# Patient Record
Sex: Female | Born: 1985 | Race: White | Marital: Married | Smoking: Never smoker
Health system: Southern US, Community
[De-identification: ages and names within clinical notes are randomized; demographics above are authoritative.]

---
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
## 2007-05-13 HISTORY — PX: KNEE ARTHROSCOPY: SUR90

## 2013-05-12 HISTORY — PX: LUNG SURGERY: SHX703

## 2018-05-12 NOTE — L&D Delivery Note (Signed)
Delivery Note  First Stage: Labor onset: w/ AROM Augmentation: AROM Analgesia /Anesthesia intrapartum: epidura AROM at 3338  Second Stage: Complete dilation at 2214 Onset of pushing at 2230 FHR second stage cat II tracing, 115bpm, moderate variability.   Delivery of a viable female infant on 04/21/19 at 2251 by Hassan Buckler CNM delivery of fetal head in OA position with restitution to ROA. Loose nuchal cord x 2;  Anterior then posterior shoulders delivered easily with gentle downward traction. Baby placed on mom's chest, and attended to by peds.  Cord double clamped after cessation of pulsation, cut by CNM at parents request.   Third Stage: Placenta delivered spontaneously intact with 3VC @ 2257 Placenta disposition: routine disposal, calcifications noted throughout placenta maternal surface.  Uterine tone Firm / bleeding small.   No lacerations identified on inspection of vagina, cervix and perineum. Anesthesia for repair: n/a Repair n/a Est. Blood Loss (mL): 329VB  Complications: none  Mom to postpartum.  Baby to Couplet care / Skin to Skin.  Newborn: Birth Weight: pending  Apgar Scores: 8/9 Feeding planned: breast

## 2018-10-08 LAB — OB RESULTS CONSOLE RUBELLA ANTIBODY, IGM: Rubella: IMMUNE

## 2018-10-08 LAB — OB RESULTS CONSOLE HEPATITIS B SURFACE ANTIGEN: Hepatitis B Surface Ag: NEGATIVE

## 2018-10-08 LAB — OB RESULTS CONSOLE VARICELLA ZOSTER ANTIBODY, IGG: Varicella: IMMUNE

## 2019-04-05 LAB — OB RESULTS CONSOLE HIV ANTIBODY (ROUTINE TESTING): HIV: NONREACTIVE

## 2019-04-21 ENCOUNTER — Inpatient Hospital Stay: Payer: BLUE CROSS/BLUE SHIELD | Admitting: Anesthesiology

## 2019-04-21 ENCOUNTER — Inpatient Hospital Stay
Admission: EM | Admit: 2019-04-21 | Discharge: 2019-04-23 | DRG: 807 | Disposition: A | Discharge status: Home / Self Care | Payer: BLUE CROSS/BLUE SHIELD | Attending: Obstetrics and Gynecology | Admitting: Obstetrics and Gynecology

## 2019-04-21 ENCOUNTER — Other Ambulatory Visit: Payer: Self-pay

## 2019-04-21 DIAGNOSIS — Z3A38 38 weeks gestation of pregnancy: Secondary | ICD-10-CM | POA: Diagnosis not present

## 2019-04-21 DIAGNOSIS — O9081 Anemia of the puerperium: Secondary | ICD-10-CM | POA: Diagnosis not present

## 2019-04-21 DIAGNOSIS — O26893 Other specified pregnancy related conditions, third trimester: Secondary | ICD-10-CM | POA: Diagnosis present

## 2019-04-21 DIAGNOSIS — Z20828 Contact with and (suspected) exposure to other viral communicable diseases: Secondary | ICD-10-CM | POA: Diagnosis present

## 2019-04-21 LAB — CBC
HCT: 35.6 % — ABNORMAL LOW (ref 36.0–46.0)
Hemoglobin: 12 g/dL (ref 12.0–15.0)
MCH: 30.8 pg (ref 26.0–34.0)
MCHC: 33.7 g/dL (ref 30.0–36.0)
MCV: 91.3 fL (ref 80.0–100.0)
Platelets: 273 10*3/uL (ref 150–400)
RBC: 3.9 MIL/uL (ref 3.87–5.11)
RDW: 13.1 % (ref 11.5–15.5)
WBC: 12.8 10*3/uL — ABNORMAL HIGH (ref 4.0–10.5)
nRBC: 0 % (ref 0.0–0.2)

## 2019-04-21 LAB — TYPE AND SCREEN
ABO/RH(D): A POS
Antibody Screen: NEGATIVE

## 2019-04-21 LAB — RESPIRATORY PANEL BY RT PCR (FLU A&B, COVID)
Influenza A by PCR: NEGATIVE
Influenza B by PCR: NEGATIVE
SARS Coronavirus 2 by RT PCR: NEGATIVE

## 2019-04-21 MED ORDER — BUPIVACAINE HCL (PF) 0.25 % IJ SOLN
INTRAMUSCULAR | Status: DC | PRN
  Administered 2019-04-21: 4 mL via EPIDURAL

## 2019-04-21 MED ORDER — LACTATED RINGERS IV SOLN
500.0000 mL | INTRAVENOUS | Status: DC | PRN
  Administered 2019-04-21: 250 mL via INTRAVENOUS

## 2019-04-21 MED ORDER — FENTANYL 2.5 MCG/ML W/ROPIVACAINE 0.15% IN NS 100 ML EPIDURAL (ARMC)
EPIDURAL | Status: DC | PRN
  Administered 2019-04-21: 12 mL/h via EPIDURAL

## 2019-04-21 MED ORDER — OXYTOCIN 40 UNITS IN NORMAL SALINE INFUSION - SIMPLE MED
2.5000 [IU]/h | INTRAVENOUS | Status: DC
  Administered 2019-04-21: 2.5 [IU]/h via INTRAVENOUS
  Filled 2019-04-21: qty 1000

## 2019-04-21 MED ORDER — MISOPROSTOL 200 MCG PO TABS
ORAL_TABLET | ORAL | Status: AC
  Filled 2019-04-21: qty 4

## 2019-04-21 MED ORDER — ACETAMINOPHEN 325 MG PO TABS: 650.0000 mg | ORAL_TABLET | ORAL | Status: DC | PRN

## 2019-04-21 MED ORDER — FENTANYL CITRATE (PF) 100 MCG/2ML IJ SOLN: 50.0000 ug | INTRAMUSCULAR | Status: DC | PRN

## 2019-04-21 MED ORDER — LACTATED RINGERS IV SOLN
INTRAVENOUS | Status: DC
  Administered 2019-04-21 (×2): via INTRAVENOUS

## 2019-04-21 MED ORDER — IBUPROFEN 600 MG PO TABS
600.0000 mg | ORAL_TABLET | Freq: Four times a day (QID) | ORAL | Status: DC
  Administered 2019-04-22 – 2019-04-23 (×6): 600 mg via ORAL
  Filled 2019-04-21 (×6): qty 1

## 2019-04-21 MED ORDER — ONDANSETRON HCL 4 MG/2ML IJ SOLN: 4.0000 mg | Freq: Four times a day (QID) | INTRAMUSCULAR | Status: DC | PRN

## 2019-04-21 MED ORDER — LIDOCAINE HCL (PF) 1 % IJ SOLN
INTRAMUSCULAR | Status: DC | PRN
  Administered 2019-04-21: 3 mL via SUBCUTANEOUS

## 2019-04-21 MED ORDER — IBUPROFEN 600 MG PO TABS
ORAL_TABLET | ORAL | Status: AC
  Administered 2019-04-21: 600 mg via ORAL
  Filled 2019-04-21: qty 1

## 2019-04-21 MED ORDER — FENTANYL 2.5 MCG/ML W/ROPIVACAINE 0.15% IN NS 100 ML EPIDURAL (ARMC)
EPIDURAL | Status: AC
  Filled 2019-04-21: qty 100

## 2019-04-21 MED ORDER — SOD CITRATE-CITRIC ACID 500-334 MG/5ML PO SOLN: 30.0000 mL | ORAL | Status: DC | PRN

## 2019-04-21 MED ORDER — LIDOCAINE HCL (PF) 1 % IJ SOLN
30.0000 mL | INTRAMUSCULAR | Status: DC | PRN
  Filled 2019-04-21: qty 30

## 2019-04-21 MED ORDER — LIDOCAINE-EPINEPHRINE (PF) 1.5 %-1:200000 IJ SOLN
INTRAMUSCULAR | Status: DC | PRN
  Administered 2019-04-21: 4 mL via EPIDURAL

## 2019-04-21 MED ORDER — OXYTOCIN BOLUS FROM INFUSION
500.0000 mL | Freq: Once | INTRAVENOUS | Status: AC
  Administered 2019-04-21: 500 mL via INTRAVENOUS

## 2019-04-21 NOTE — Discharge Summary (Signed)
Obstetrical Discharge Summary  Patient Name: Felicia Hammond DOB: 1985-07-05 MRN: [id number]  Date of Admission: 04/21/2019 Date of Delivery: 04/21/19 Delivered by: Heloise Ochoa CNM Date of Discharge: 04/23/2019  Primary OB: [facility]  LMP:No LMP recorded. EDC Estimated Date of Delivery: 05/01/19 Gestational Age at Delivery: [redacted]w[redacted]d   Antepartum complications: 1. resolved low lying placenta at 28wks 2. Hx recurrent spontaneous pneumothorax, s/p catamenial lung surgery 3. IVF pregnancy, embryo tx on 08/13/18, normal fetal echo on 12/23/18  Admitting Diagnosis: labor, advanced cervical dilation Secondary Diagnosis: SVD  Patient Active Problem List   Diagnosis Date Noted  . NSVD (normal spontaneous vaginal delivery) 04/23/2019  . Anemia, postpartum 04/23/2019    Augmentation: AROM Complications: None Intrapartum complications/course: admitted with advanced cervical dilation, AROM performed, spontaneous progression to C/C/+1. Pushed effectively to SVD over intact perineum.  Date of Delivery: 04/21/19 Delivered by: Heloise Ochoa CNM Delivery Type: spontaneous vaginal delivery Anesthesia: epidural Placenta: spontaneous Laceration: none Episiotomy: none Newborn Data: Live born female "Christian" Birth Weight:  8#11  3960g APGAR: 8, 9  Newborn Delivery   Birth date/time: 04/21/2019 22:51:00 Delivery type: Vaginal, Spontaneous        Postpartum Procedures: none  Post partum course:  Patient had an uncomplicated postpartum course.  By time of discharge on PPD#2, her pain was controlled on oral pain medications; she had appropriate lochia and was ambulating, voiding without difficulty and tolerating regular diet.  She was deemed stable for discharge to home.    Discharge Physical Exam:  BP 110/81 (BP Location: Right Arm)   Pulse 98   Temp 98.6 F (37 C) (Oral)   Resp 18   Ht 5\' 6"  (1.676 m)   Wt 74.4 kg   SpO2 97%   Breastfeeding Unknown   BMI 26.47 kg/m    General: NAD CV: RRR Pulm: nl effort ABD: fundus firm and below the umbilicus Lochia: minimal Perineum: intact DVT Evaluation: LE non-ttp, no evidence of DVT on exam.  Hemoglobin  Date Value Ref Range Status  04/22/2019 9.9 (L) 12.0 - 15.0 g/dL Final   HCT  Date Value Ref Range Status  04/22/2019 31.4 (L) 36.0 - 46.0 % Final     Disposition: stable, discharge to home. Baby Feeding: breastmilk Baby Disposition: home with mom  Rh Immune globulin given: n/a Rubella vaccine given: immune Varicella vaccine given: immune Tdap vaccine given in AP setting: 02/07/19 Flu vaccine given in AP setting: 02/07/19  Contraception: n/a, infertility  Prenatal Labs:  Blood type/Rh   A Pos  Antibody screen neg  Rubella Immune  Varicella Immune  RPR NR  HBsAg Neg  HIV NR  GC neg  Chlamydia neg  Genetic screening negative  1 hour GTT  111  GBS  negative      Plan:  Felicia Hammond was discharged to home in good condition. Follow-up appointment with delivering provider in 6 weeks.  Discharge Medications: Allergies as of 04/23/2019      Reactions   Amoxicillin Rash      Medication List    TAKE these medications   multivitamin-prenatal 27-0.8 MG Tabs tablet Take 1 tablet by mouth daily at 12 noon.   valACYclovir 500 MG tablet Commonly known as: VALTREX Take 500 mg by mouth 2 (two) times daily.     Advised to get Ferrous Sulfate 325 mg - take 1 tab once a day  Follow-up Information    [place], 14/04/2019, CNM. Schedule an appointment as soon as possible for a visit  in 6 week(s).   Specialty: Obstetrics and Gynecology Contact information: [place] [address] [phone]           Signed:  Clydene Laming, CNM 04/23/2019  8:46 AM

## 2019-04-21 NOTE — Anesthesia Procedure Notes (Signed)
Epidural Patient location during procedure: OB Start time: 04/21/2019 4:44 PM End time: 04/21/2019 4:55 PM  Staffing Anesthesiologist: Gunnar Fusi, MD Resident/CRNA: Aline Brochure, CRNA Performed: resident/CRNA   Preanesthetic Checklist Completed: patient identified, IV checked, site marked, risks and benefits discussed, surgical consent, monitors and equipment checked, pre-op evaluation and timeout performed  Epidural Patient position: sitting Prep: ChloraPrep Patient monitoring: heart rate, continuous pulse ox and blood pressure Approach: midline Location: L3-L4 Injection technique: LOR air  Needle:  Needle type: Tuohy  Needle gauge: 17 G Needle length: 9 cm and 9 Needle insertion depth: 6 cm Catheter type: closed end flexible Catheter size: 19 Gauge Catheter at skin depth: 11 cm Test dose: negative and 1.5% lidocaine with Epi 1:200 K  Assessment Sensory level: T10 Events: blood not aspirated, injection not painful, no injection resistance, no paresthesia and negative IV test  Additional Notes   Patient tolerated the insertion well without complications.-SATD -IVTD. No paresthesia. Refer to [facility] for VS and dosingReason for block:procedure for pain

## 2019-04-21 NOTE — OB Triage Note (Signed)
Patient set to L&D for IOL due to advanced cervical dilation noted at [facility] visit today.  Patient is 7 cm dilated.

## 2019-04-21 NOTE — Anesthesia Preprocedure Evaluation (Signed)
Anesthesia Evaluation  Patient identified by MRN, date of birth, ID band Patient awake    Reviewed: Allergy & Precautions, H&P , NPO status , Patient's Chart, lab work & pertinent test results  History of Anesthesia Complications Negative for: history of anesthetic complications  Airway Mallampati: II       Dental no notable dental hx. (+) Teeth Intact   Pulmonary           Cardiovascular      Neuro/Psych    GI/Hepatic   Endo/Other    Renal/GU      Musculoskeletal   Abdominal   Peds  Hematology   Anesthesia Other Findings   Reproductive/Obstetrics (+) Pregnancy                             Anesthesia Physical Anesthesia Plan  ASA: II  Anesthesia Plan: Epidural   Post-op Pain Management:    Induction:   PONV Risk Score and Plan:   Airway Management Planned:   Additional Equipment:   Intra-op Plan:   Post-operative Plan:   Informed Consent: I have reviewed the patients History and Physical, chart, labs and discussed the procedure including the risks, benefits and alternatives for the proposed anesthesia with the patient or authorized representative who has indicated his/her understanding and acceptance.       Plan Discussed with: Anesthesiologist  Anesthesia Plan Comments:         Anesthesia Quick Evaluation

## 2019-04-21 NOTE — H&P (Signed)
OB History & Physical   History of Present Illness:  Chief Complaint: sent from office for advanced cervical dilation  HPI:  Felicia Hammond is a 33 y.o. G2P1001 female at [redacted]w[redacted]d dated by In Vitro and embryo transfer.  She presents to L&D for advanced dilation and augmentation. Reports active FM, occasional contractions. Slight bloody show noted.     Pregnancy Issues: 1. resolved low lying placenta at 28wks 2. Hx recurrent spontaneous pneumothorax, s/p catamenial lung surgery 3. IVF pregnancy, embryo tx on 08/13/18, normal fetal echo on 12/23/18   Maternal Medical History:  History reviewed. No pertinent past medical history.  Past Surgical History:  Procedure Laterality Date  . KNEE ARTHROSCOPY Left 2006  . KNEE ARTHROSCOPY Right 2009  . LUNG SURGERY Right 2015    Allergies  Allergen Reactions  . Amoxicillin Rash    Prior to Admission medications   Medication Sig Start Date End Date Taking? Authorizing Provider  Prenatal Vit-Fe Fumarate-FA (MULTIVITAMIN-PRENATAL) 27-0.8 MG TABS tablet Take 1 tablet by mouth daily at 12 noon.   Yes [provider]  valACYclovir (VALTREX) 500 MG tablet Take 500 mg by mouth 2 (two) times daily.   Yes [provider]     Prenatal care site: [facility] History: She  reports that she has never smoked. She has never used smokeless tobacco.  Family History: family history is not on file. She was adopted.   Review of Systems: A full review of systems was performed and negative except as noted in the HPI.     Physical Exam:  Vital Signs: BP 134/80   Pulse (!) 115   Temp 99.1 F (37.3 C) (Oral)   Resp 17   Ht 5\' 6"  (1.676 m)   Wt 74.4 kg   SpO2 100%   BMI 26.47 kg/m  General: no acute distress.  HEENT: normocephalic, atraumatic Heart: regular rate & rhythm.  No murmurs/rubs/gallops Lungs: clear to auscultation bilaterally, normal respiratory effort Abdomen: soft, gravid, non-tender;  EFW:  6lbs Pelvic:   External: Normal external female genitalia, no lesions noted.   Cervix: Dilation: 7.5 /   / Station: -1    Extremities: non-tender, symmetric, no edema bilaterally.  DTRs: 2+  Neurologic: Alert & oriented x 3.    Results for orders placed or performed during the hospital encounter of 04/21/19 (from the past 24 hour(s))  Respiratory Panel by RT PCR (Flu A&B, Covid) - Nasopharyngeal Swab     Status: None   Collection Time: 04/21/19  2:39 PM   Specimen: Nasopharyngeal Swab  Result Value Ref Range   SARS Coronavirus 2 by RT PCR NEGATIVE NEGATIVE   Influenza A by PCR NEGATIVE NEGATIVE   Influenza B by PCR NEGATIVE NEGATIVE  Type and screen [place]     Status: None   Collection Time: 04/21/19  3:24 PM  Result Value Ref Range   ABO/RH(D) A POS    Antibody Screen NEG    Sample Expiration      04/24/2019,2359 Performed at [facility], [address] [place], [place] [address]   CBC     Status: Abnormal   Collection Time: 04/21/19  3:36 PM  Result Value Ref Range   WBC 12.8 (H) 4.0 - 10.5 K/uL   RBC 3.90 3.87 - 5.11 MIL/uL   Hemoglobin 12.0 12.0 - 15.0 g/dL   HCT 35.6 (L) 36.0 - 46.0 %   MCV 91.3 80.0 - 100.0 fL   MCH 30.8  26.0 - 34.0 pg   MCHC 33.7 30.0 - 36.0 g/dL   RDW 93.2 67.1 - 24.5 %   Platelets 273 150 - 400 K/uL   nRBC 0.0 0.0 - 0.2 %    Pertinent Results:  Prenatal Labs: Blood type/Rh   A Pos  Antibody screen neg  Rubella Immune  Varicella Immune  RPR NR  HBsAg Neg  HIV NR  GC neg  Chlamydia neg  Genetic screening negative  1 hour GTT  111  GBS  negative   FHT: 120bpm, mod variability, 10*10accels TOCO: q6-6min SVE:  Dilation: 7.5 /   / Station: -1  AROM performed with fetal head well applied, large amt clear fluid noted. Fetal movement noted after AROM, fetal hand then noted at 3 o'clock, fingers palpated and then fetus drew hand back. Fetal heart rate dropped to 105bpm x , then back to previous  baseline 120bpm with moderate variability throughout.    Cephalic by leopolds  No results found.  Assessment:  Felicia Hammond is a 33 y.o. G2P1001 female at [redacted]w[redacted]d with advanced dilation, augmentation of labor.   Plan:  1. 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 Has been on prophy.   2. Fetal Well being  - Fetal Tracing: Cat I tracing now.  - Group B Streptococcus ppx indicated: negative - Presentation: cephalic confirmed by exam   3. Routine OB: - Prenatal labs reviewed, as above - Rh A Pos - CBC, T&S, RPR on admit - Clear fluids, IVF  4. Monitoring of Labor -  Contractions: external toco in place -  Pelvis proven to 8#15 -  Plan for augmentation with AROM, will consider Pitocin if UC pattern doesn't pick up.  -  Plan for continuous fetal monitoring  -  Maternal pain control as desired;  regional anesthesia - Anticipate vaginal delivery  5. Post Partum Planning: - Infant feeding: breast - Contraception: infertility, n/a  Randa Ngo, CNM 04/21/19 6:12 PM

## 2019-04-22 ENCOUNTER — Encounter: Payer: Self-pay | Admitting: Obstetrics and Gynecology

## 2019-04-22 LAB — CBC
HCT: 31.4 % — ABNORMAL LOW (ref 36.0–46.0)
Hemoglobin: 9.9 g/dL — ABNORMAL LOW (ref 12.0–15.0)
MCH: 30.1 pg (ref 26.0–34.0)
MCHC: 31.5 g/dL (ref 30.0–36.0)
MCV: 95.4 fL (ref 80.0–100.0)
Platelets: 270 10*3/uL (ref 150–400)
RBC: 3.29 MIL/uL — ABNORMAL LOW (ref 3.87–5.11)
RDW: 13.1 % (ref 11.5–15.5)
WBC: 18.7 10*3/uL — ABNORMAL HIGH (ref 4.0–10.5)
nRBC: 0 % (ref 0.0–0.2)

## 2019-04-22 LAB — RPR: RPR Ser Ql: NONREACTIVE

## 2019-04-22 MED ORDER — ONDANSETRON HCL 4 MG/2ML IJ SOLN: 4.0000 mg | INTRAMUSCULAR | Status: DC | PRN

## 2019-04-22 MED ORDER — BENZOCAINE-MENTHOL 20-0.5 % EX AERO: 1.0000 "application " | INHALATION_SPRAY | CUTANEOUS | Status: DC | PRN

## 2019-04-22 MED ORDER — DIPHENHYDRAMINE HCL 25 MG PO CAPS: 25.0000 mg | ORAL_CAPSULE | Freq: Four times a day (QID) | ORAL | Status: DC | PRN

## 2019-04-22 MED ORDER — COCONUT OIL OIL: 1.0000 "application " | TOPICAL_OIL | Status: DC | PRN

## 2019-04-22 MED ORDER — ACETAMINOPHEN 325 MG PO TABS: 650.0000 mg | ORAL_TABLET | ORAL | Status: DC | PRN

## 2019-04-22 MED ORDER — SIMETHICONE 80 MG PO CHEW: 80.0000 mg | CHEWABLE_TABLET | ORAL | Status: DC | PRN

## 2019-04-22 MED ORDER — PRENATAL MULTIVITAMIN CH
1.0000 | ORAL_TABLET | Freq: Every day | ORAL | Status: DC
  Administered 2019-04-22 – 2019-04-23 (×2): 1 via ORAL
  Filled 2019-04-22 (×2): qty 1

## 2019-04-22 MED ORDER — SENNOSIDES-DOCUSATE SODIUM 8.6-50 MG PO TABS: 2.0000 | ORAL_TABLET | ORAL | Status: DC

## 2019-04-22 MED ORDER — DIBUCAINE (PERIANAL) 1 % EX OINT: 1.0000 "application " | TOPICAL_OINTMENT | CUTANEOUS | Status: DC | PRN

## 2019-04-22 MED ORDER — ONDANSETRON HCL 4 MG PO TABS: 4.0000 mg | ORAL_TABLET | ORAL | Status: DC | PRN

## 2019-04-22 MED ORDER — WITCH HAZEL-GLYCERIN EX PADS: 1.0000 "application " | MEDICATED_PAD | CUTANEOUS | Status: DC | PRN

## 2019-04-22 MED ORDER — ZOLPIDEM TARTRATE 5 MG PO TABS: 5.0000 mg | ORAL_TABLET | Freq: Every evening | ORAL | Status: DC | PRN

## 2019-04-22 MED ORDER — SENNOSIDES-DOCUSATE SODIUM 8.6-50 MG PO TABS
2.0000 | ORAL_TABLET | ORAL | Status: DC
  Administered 2019-04-22 – 2019-04-23 (×2): 2 via ORAL
  Filled 2019-04-22 (×2): qty 2

## 2019-04-22 NOTE — Progress Notes (Signed)
Post Partum Day 1 Subjective: no complaints, up ad lib, voiding, tolerating PO and + flatus  Objective: Blood pressure 125/85, pulse 96, temperature 97.6 F (36.4 C), temperature source Oral, resp. rate 16, height 5\' 6"  (1.676 m), weight 74.4 kg, SpO2 98 %, unknown if currently breastfeeding.  Physical Exam:  General: alert and cooperative Lochia: appropriate Uterine Fundus: firm Incision: n/a DVT Evaluation: No evidence of DVT seen on physical exam.  Recent Labs    04/21/19 1536 04/22/19 0441  HGB 12.0 9.9*  HCT 35.6* 31.4*    Assessment/Plan: Plan for discharge tomorrow   LOS: 1 day   [place] 04/22/2019, 12:56 PM

## 2019-04-22 NOTE — Lactation Note (Signed)
This note was copied from a baby's chart. Lactation Consultation Note  Patient Name: Felicia Hammond [id number] Date: 04/22/2019 Reason for consult: Initial assessment;Early term 71-38.6wks Mom feels assured about  Breastfeeding, requests no breastfeeding help  Maternal Data Formula Feeding for Exclusion: No Has patient been taught Hand Expression?: No Does the patient have breastfeeding experience prior to this delivery?: Yes  Feeding Feeding Type: Breast Fed  LATCH Score Latch: Grasps breast easily, tongue down, lips flanged, rhythmical sucking.  Audible Swallowing: Spontaneous and intermittent  Type of Nipple: Everted at rest and after stimulation  Comfort (Breast/Nipple): Soft / non-tender  Hold (Positioning): No assistance needed to correctly position infant at breast.  LATCH Score: 10  Interventions    Lactation Tools Discussed/Used WIC Program: No   Consult Status Consult Status: PRN    Ferol Luz 04/22/2019, 5:12 PM

## 2019-04-22 NOTE — Anesthesia Postprocedure Evaluation (Signed)
Anesthesia Post Note  Patient: Felicia Hammond  Procedure(s) Performed: AN AD [place]  Patient location during evaluation: Mother Baby Anesthesia Type: Epidural Level of consciousness: awake and alert and oriented Pain management: pain level controlled Vital Signs Assessment: post-procedure vital signs reviewed and stable Respiratory status: spontaneous breathing and respiratory function stable Cardiovascular status: stable Postop Assessment: no headache, no backache, no apparent nausea or vomiting, adequate PO intake, able to ambulate and patient able to bend at knees Anesthetic complications: no     Last Vitals:  Vitals:   04/22/19 0158 04/22/19 0312  BP: 117/71 123/74  Pulse: 76 93  Resp: 18 18  Temp: 36.4 C (!) 36.4 C  SpO2: 99% 98%    Last Pain:  Vitals:   04/22/19 0556  TempSrc:   PainSc: 0-No pain                 Lanora Manis

## 2019-04-23 DIAGNOSIS — O9081 Anemia of the puerperium: Secondary | ICD-10-CM | POA: Diagnosis not present

## 2019-04-23 NOTE — Progress Notes (Signed)
Pt discharged with infant. Discharge instructions, prescriptions, and follow up appointments given to and reviewed with patient. Pt verbalized understanding. Escorted out by staff. 

## 2019-04-23 NOTE — Discharge Instructions (Signed)
Iron supplement: Ferrous Sulfate    Postpartum Care After Vaginal Delivery This sheet gives you information about how to care for yourself from the time you deliver your baby to up to 6-12 weeks after delivery (postpartum period). Your health care provider may also give you more specific instructions. If you have problems or questions, contact your health care provider. Follow these instructions at home: Vaginal bleeding  It is normal to have vaginal bleeding (lochia) after delivery. Wear a sanitary pad for vaginal bleeding and discharge. ? During the first week after delivery, the amount and appearance of lochia is often similar to a menstrual period. ? Over the next few weeks, it will gradually decrease to a dry, yellow-brown discharge. ? For most women, lochia stops completely by 4-6 weeks after delivery. Vaginal bleeding can vary from woman to woman.  Change your sanitary pads frequently. Watch for any changes in your flow, such as: ? A sudden increase in volume. ? A change in color. ? Large blood clots.  If you pass a blood clot from your vagina, save it and call your health care provider to discuss. Do not flush blood clots down the toilet before talking with your health care provider.  Do not use tampons or douches until your health care provider says this is safe.  If you are not breastfeeding, your period should return 6-8 weeks after delivery. If you are feeding your child breast milk only (exclusive breastfeeding), your period may not return until you stop breastfeeding. Perineal care  Keep the area between the vagina and the anus (perineum) clean and dry as told by your health care provider. Use medicated pads and pain-relieving sprays and creams as directed.  If you had a cut in the perineum (episiotomy) or a tear in the vagina, check the area for signs of infection until you are healed. Check for: ? More redness, swelling, or pain. ? Fluid or blood coming from the cut  or tear. ? Warmth. ? Pus or a bad smell.  You may be given a squirt bottle to use instead of wiping to clean the perineum area after you go to the bathroom. As you start healing, you may use the squirt bottle before wiping yourself. Make sure to wipe gently.  To relieve pain caused by an episiotomy, a tear in the vagina, or swollen veins in the anus (hemorrhoids), try taking a warm sitz bath 2-3 times a day. A sitz bath is a warm water bath that is taken while you are sitting down. The water should only come up to your hips and should cover your buttocks. Breast care  Within the first few days after delivery, your breasts may feel heavy, full, and uncomfortable (breast engorgement). Milk may also leak from your breasts. Your health care provider can suggest ways to help relieve the discomfort. Breast engorgement should go away within a few days.  If you are breastfeeding: ? Wear a bra that supports your breasts and fits you well. ? Keep your nipples clean and dry. Apply creams and ointments as told by your health care provider. ? You may need to use breast pads to absorb milk that leaks from your breasts. ? You may have uterine contractions every time you breastfeed for up to several weeks after delivery. Uterine contractions help your uterus return to its normal size. ? If you have any problems with breastfeeding, work with your health care provider or Advertising copywriter.  If you are not breastfeeding: ? Avoid touching your  breasts a lot. Doing this can make your breasts produce more milk. ? Wear a good-fitting bra and use cold packs to help with swelling. ? Do not squeeze out (express) milk. This causes you to make more milk. Intimacy and sexuality  Ask your health care provider when you can engage in sexual activity. This may depend on: ? Your risk of infection. ? How fast you are healing. ? Your comfort and desire to engage in sexual activity.  You are able to get pregnant after  delivery, even if you have not had your period. If desired, talk with your health care provider about methods of birth control (contraception). Medicines  Take over-the-counter and prescription medicines only as told by your health care provider.  If you were prescribed an antibiotic medicine, take it as told by your health care provider. Do not stop taking the antibiotic even if you start to feel better. Activity  Gradually return to your normal activities as told by your health care provider. Ask your health care provider what activities are safe for you.  Rest as much as possible. Try to rest or take a nap while your baby is sleeping. Eating and drinking   Drink enough fluid to keep your urine pale yellow.  Eat high-fiber foods every day. These may help prevent or relieve constipation. High-fiber foods include: ? Whole grain cereals and breads. ? Brown rice. ? Beans. ? Fresh fruits and vegetables.  Do not try to lose weight quickly by cutting back on calories.  Take your prenatal vitamins until your postpartum checkup or until your health care provider tells you it is okay to stop. Lifestyle  Do not use any products that contain nicotine or tobacco, such as cigarettes and e-cigarettes. If you need help quitting, ask your health care provider.  Do not drink alcohol, especially if you are breastfeeding. General instructions  Keep all follow-up visits for you and your baby as told by your health care provider. Most women visit their health care provider for a postpartum checkup within the first 3-6 weeks after delivery. Contact a health care provider if:  You feel unable to cope with the changes that your child brings to your life, and these feelings do not go away.  You feel unusually sad or worried.  Your breasts become red, painful, or hard.  You have a fever.  You have trouble holding urine or keeping urine from leaking.  You have little or no interest in activities  you used to enjoy.  You have not breastfed at all and you have not had a menstrual period for 12 weeks after delivery.  You have stopped breastfeeding and you have not had a menstrual period for 12 weeks after you stopped breastfeeding.  You have questions about caring for yourself or your baby.  You pass a blood clot from your vagina. Get help right away if:  You have chest pain.  You have difficulty breathing.  You have sudden, severe leg pain.  You have severe pain or cramping in your lower abdomen.  You bleed from your vagina so much that you fill more than one sanitary pad in one hour. Bleeding should not be heavier than your heaviest period.  You develop a severe headache.  You faint.  You have blurred vision or spots in your vision.  You have bad-smelling vaginal discharge.  You have thoughts about hurting yourself or your baby. If you ever feel like you may hurt yourself or others, or have thoughts  about taking your own life, get help right away. You can go to the nearest emergency department or call:  Your local emergency services (911 in the [place]  A suicide crisis helpline, such as the National Suicide Prevention Lifeline at 431-102-2960. This is open 24 hours a day. Summary  The period of time right after you deliver your newborn up to 6-12 weeks after delivery is called the postpartum period.  Gradually return to your normal activities as told by your health care provider.  Keep all follow-up visits for you and your baby as told by your health care provider. This information is not intended to replace advice given to you by your health care provider. Make sure you discuss any questions you have with your health care provider. Document Released: 02/23/2007 Document Revised: 05/01/2017 Document Reviewed: 02/09/2017 Elsevier Patient Education  2020 [employer].   Postpartum Baby Blues The postpartum period begins right after the birth of a baby. During  this time, there is often a lot of joy and excitement. It is also a time of many changes in the life of the parents. No matter how many times a mother gives birth, each child brings new challenges to the family, including different ways of relating to one another. It is common to have feelings of excitement along with confusing changes in moods, emotions, and thoughts. You may feel happy one minute and sad or stressed the next. These feelings of sadness usually happen in the period right after you have your baby, and they go away within a week or two. This is called the "baby blues." What are the causes? There is no known cause of baby blues. It is likely caused by a combination of factors. However, changes in hormone levels after childbirth are believed to trigger some of the symptoms. Other factors that can play a role in these mood changes include:  Lack of sleep.  Stressful life events, such as poverty, caring for a loved one, or death of a loved one.  Genetics. What are the signs or symptoms? Symptoms of this condition include:  Brief changes in mood, such as going from extreme happiness to sadness.  Decreased concentration.  Difficulty sleeping.  Crying spells and tearfulness.  Loss of appetite.  Irritability.  Anxiety. If the symptoms of baby blues last for more than 2 weeks or become more severe, you may have postpartum depression. How is this diagnosed? This condition is diagnosed based on an evaluation of your symptoms. There are no medical or lab tests that lead to a diagnosis, but there are various questionnaires that a health care provider may use to identify women with the baby blues or postpartum depression. How is this treated? Treatment is not needed for this condition. The baby blues usually go away on their own in 1-2 weeks. Social support is often all that is needed. You will be encouraged to get adequate sleep and rest. Follow these instructions at  home: Lifestyle      Get as much rest as you can. Take a nap when the baby sleeps.  Exercise regularly as told by your health care provider. Some women find yoga and walking to be helpful.  Eat a balanced and nourishing diet. This includes plenty of fruits and vegetables, whole grains, and lean proteins.  Do little things that you enjoy. Have a cup of tea, take a bubble bath, read your favorite magazine, or listen to your favorite music.  Avoid alcohol.  Ask for help with household  chores, cooking, grocery shopping, or running errands. Do not try to do everything yourself. Consider hiring a postpartum doula to help. This is a professional who specializes in providing support to new mothers.  Try not to make any major life changes during pregnancy or right after giving birth. This can add stress. General instructions  Talk to people close to you about how you are feeling. Get support from your partner, family members, friends, or other new moms. You may want to join a support group.  Find ways to cope with stress. This may include: ? Writing your thoughts and feelings in a journal. ? Spending time outside. ? Spending time with people who make you laugh.  Try to stay positive in how you think. Think about the things you are grateful for.  Take over-the-counter and prescription medicines only as told by your health care provider.  Let your health care provider know if you have any concerns.  Keep all postpartum visits as told by your health care provider. This is important. Contact a health care provider if:  Your baby blues do not go away after 2 weeks. Get help right away if:  You have thoughts of taking your own life (suicidal thoughts).  You think you may harm the baby or other people.  You see or hear things that are not there (hallucinations). Summary  After giving birth, you may feel happy one minute and sad or stressed the next. Feelings of sadness that happen  right after the baby is born and go away after a week or two are called the "baby blues."  You can manage the baby blues by getting enough rest, eating a healthy diet, exercising, spending time with supportive people, and finding ways to cope with stress.  If feelings of sadness and stress last longer than 2 weeks or get in the way of caring for your baby, talk to your health care provider. This may mean you have postpartum depression. This information is not intended to replace advice given to you by your health care provider. Make sure you discuss any questions you have with your health care provider. Document Released: 01/31/2004 Document Revised: 08/20/2018 Document Reviewed: 06/24/2016 Elsevier Patient Education  2020 [employer].   Breastfeeding and Self-Care Breastfeeding can be challenging, especially during the first few weeks after childbirth. It is normal to have some problems when you start to breastfeed your new baby, even if you have breastfed before. There are things that you can do to take care of yourself and help prevent common breastfeeding problems. Work with your health care provider or breastfeeding specialist (Science writer) to find strategies that work best for you. How does this affect me? Keeping your breasts healthy and ensuring that your baby attaches to your nipple well (a good latch) are important parts of having a good breastfeeding experience. Poor latching can lead to problems, such as:  Cracked or sore nipples.  Breasts becoming overfilled with milk (engorgement).  Plugged milk ducts.  Low milk supply.  Breast inflammation or infection. How does this affect my baby? By taking steps to avoid breastfeeding problems, you will help ensure that your baby can feed effectively and gain weight as he or she should. Follow these instructions at home: Breastfeeding strategy   Always make sure that your baby latches and is in a proper position. Try  different breastfeeding positions to find one that works best for you and your baby.  Breastfeed when you feel the need to reduce the  fullness of your breasts or when your baby shows signs of hunger. This is called "breastfeeding on demand."  Do not delay feedings.  Try to relax when it is time to feed your baby. This helps to trigger your let-down reflex, which releases milk from your breast.  To help increase milk flow: ? Pump or hand express a small amount of breast milk right before breastfeeding to soften your breast, areola, and nipple. ? Apply warm, moist heat to your breast right before feeding to increase circulation and help milk flow. You can do this in the shower or with hand towels soaked with warm water. ? Massage your breast right before or during feeding to increase circulation and help milk flow. Breast care   Ensure that your breasts stay moisturized and healthy. This will help prevent cracking and ease soreness. To do this: ? Avoid using soap on your nipples. ? Let your nipples air-dry for 3-4 minutes after each feeding. ? Use only cotton bra pads to absorb breast milk that leaks. Be sure to change the pads if they become soaked with milk. If you use disposable bra pads, change them often. ? Use lanolin on your nipples after nursing. If you use pure lanolin, you do not need to wash it off before feeding your baby again. Pure lanolin is not poisonous (toxic) to your baby. ? Massage some breast milk into your nipples: ? Use your hand to squeeze out a few drops of breast milk (hand express). ? Gently massage the milk into your nipples. ? Let your nipples air-dry.  Wear a supportive nursing bra. Avoid wearing tight clothing, bras that put pressure on your breasts, or underwire bras.  Use cold therapy to help relieve pain or swelling of your breasts: ? Put ice in a plastic bag. ? Place a towel between your skin and the bag. ? Leave the ice on for 20 minutes, 2-3 times a  day. General instructions  Drink enough fluid to keep your urine pale yellow.  Get plenty of rest. Sleep when your baby sleeps.  Talk to your health care provider or lactation consultant before taking any herbal supplements. Contact a health care provider if:  You have nipple pain.  You have cracking or soreness in your nipples that lasts longer than 1 week.  You have breast engorgement that lasts longer than 48 hours.  You have a fever.  You have pus-like discharge coming from your nipple.  You have redness, a rash, swelling, itching, or burning on your breast.  Your baby does not gain weight or loses weight. Summary  Keeping your breasts healthy and ensuring a good latch are important parts of having a good breastfeeding experience. Take steps to take care of yourself and work with your health care provider or breastfeeding specialist (Advertising copywriterlactation consultant) to find strategies that work best for you.  Always make sure that your baby is latched and positioned properly. Try different breastfeeding positions to find one that works best for you and your baby.  Keep your nipples moisturized, drink plenty of fluid, and get plenty of rest. Feed on demand, and do not delay feedings. This information is not intended to replace advice given to you by your health care provider. Make sure you discuss any questions you have with your health care provider. Document Released: 12/03/2016 Document Revised: 08/18/2018 Document Reviewed: 12/03/2016 Elsevier Patient Education  2020 [employer] Inc[employer]   Iron-Rich Diet  Iron is a mineral that helps your body to produce hemoglobin.  Hemoglobin is a protein in red blood cells that carries oxygen to your body's tissues. Eating too little iron may cause you to feel weak and tired, and it can increase your risk of infection. Iron is naturally found in many foods, and many foods have iron added to them (iron-fortified foods). You may need to follow an  iron-rich diet if you do not have enough iron in your body due to certain medical conditions. The amount of iron that you need each day depends on your age, your sex, and any medical conditions you have. Follow instructions from your health care provider or a diet and nutrition specialist (dietitian) about how much iron you should eat each day. What are tips for following this plan? Reading food labels  Check food labels to see how many milligrams (mg) of iron are in each serving. Cooking  Cook foods in pots and pans that are made from iron.  Take these steps to make it easier for your body to absorb iron from certain foods: ? Soak beans overnight before cooking. ? Soak whole grains overnight and drain them before using. ? Ferment flours before baking, such as by using yeast in bread dough. Meal planning  When you eat foods that contain iron, you should eat them with foods that are high in vitamin C. These include oranges, peppers, tomatoes, potatoes, and mango. Vitamin C helps your body to absorb iron. General information  Take iron supplements only as told by your health care provider. An overdose of iron can be life-threatening. If you were prescribed iron supplements, take them with orange juice or a vitamin C supplement.  When you eat iron-fortified foods or take an iron supplement, you should also eat foods that naturally contain iron, such as meat, poultry, and fish. Eating naturally iron-rich foods helps your body to absorb the iron that is added to other foods or contained in a supplement.  Certain foods and drinks prevent your body from absorbing iron properly. Avoid eating these foods in the same meal as iron-rich foods or with iron supplements. These foods include: ? Coffee, black tea, and red wine. ? Milk, dairy products, and foods that are high in calcium. ? Beans and soybeans. ? Whole grains. What foods should I eat? Fruits Prunes. Raisins. Eat fruits high in vitamin C,  such as oranges, grapefruits, and strawberries, alongside iron-rich foods. Vegetables Spinach (cooked). Green peas. Broccoli. Fermented vegetables. Eat vegetables high in vitamin C, such as leafy greens, potatoes, bell peppers, and tomatoes, alongside iron-rich foods. Grains Iron-fortified breakfast cereal. Iron-fortified whole-wheat bread. Enriched rice. Sprouted grains. Meats and other proteins Beef liver. Oysters. Beef. Shrimp. [place]. Chicken. Tuna. Sardines. Chickpeas. Nuts. Tofu. Pumpkin seeds. Beverages Tomato juice. Fresh orange juice. Prune juice. Hibiscus tea. Fortified instant breakfast shakes. Sweets and desserts Blackstrap molasses. Seasonings and condiments Tahini. Fermented soy sauce. Other foods Wheat germ. The items listed above may not be a complete list of recommended foods and beverages. Contact a dietitian for more information. What foods should I avoid? Grains Whole grains. Bran cereal. Bran flour. Oats. Meats and other proteins Soybeans. Products made from soy protein. Black beans. Lentils. Mung beans. Split peas. Dairy Milk. Cream. Cheese. Yogurt. Cottage cheese. Beverages Coffee. Black tea. Red wine. Sweets and desserts Cocoa. Chocolate. Ice cream. Other foods Basil. Oregano. Large amounts of parsley. The items listed above may not be a complete list of foods and beverages to avoid. Contact a dietitian for more information. Summary  Iron is a mineral that  helps your body to produce hemoglobin. Hemoglobin is a protein in red blood cells that carries oxygen to your body's tissues.  Iron is naturally found in many foods, and many foods have iron added to them (iron-fortified foods).  When you eat foods that contain iron, you should eat them with foods that are high in vitamin C. Vitamin C helps your body to absorb iron.  Certain foods and drinks prevent your body from absorbing iron properly, such as whole grains and dairy products. You should avoid eating  these foods in the same meal as iron-rich foods or with iron supplements. This information is not intended to replace advice given to you by your health care provider. Make sure you discuss any questions you have with your health care provider. Document Released: 12/10/2004 Document Revised: 04/10/2017 Document Reviewed: 03/24/2017 Elsevier Patient Education  2020 [employer].

## 2019-04-30 ENCOUNTER — Emergency Department: Payer: BLUE CROSS/BLUE SHIELD | Admitting: Anesthesiology

## 2019-04-30 ENCOUNTER — Encounter
Admission: EM | Disposition: A | Discharge status: Home / Self Care | Payer: Self-pay | Source: Home / Self Care | Attending: Emergency Medicine

## 2019-04-30 ENCOUNTER — Encounter: Payer: Self-pay | Admitting: Emergency Medicine

## 2019-04-30 ENCOUNTER — Ambulatory Visit
Admission: EM | Admit: 2019-04-30 | Discharge: 2019-04-30 | Disposition: A | Discharge status: Home / Self Care | Payer: BLUE CROSS/BLUE SHIELD | Attending: Obstetrics and Gynecology | Admitting: Obstetrics and Gynecology

## 2019-04-30 ENCOUNTER — Other Ambulatory Visit: Payer: Self-pay

## 2019-04-30 DIAGNOSIS — N939 Abnormal uterine and vaginal bleeding, unspecified: Secondary | ICD-10-CM | POA: Diagnosis not present

## 2019-04-30 DIAGNOSIS — Z20828 Contact with and (suspected) exposure to other viral communicable diseases: Secondary | ICD-10-CM | POA: Insufficient documentation

## 2019-04-30 DIAGNOSIS — Z880 Allergy status to penicillin: Secondary | ICD-10-CM | POA: Insufficient documentation

## 2019-04-30 DIAGNOSIS — D649 Anemia, unspecified: Secondary | ICD-10-CM | POA: Insufficient documentation

## 2019-04-30 HISTORY — PX: DILATION AND EVACUATION: SHX1459

## 2019-04-30 LAB — BASIC METABOLIC PANEL
Anion gap: 8 (ref 5–15)
BUN: 15 mg/dL (ref 6–20)
CO2: 26 mmol/L (ref 22–32)
Calcium: 8.9 mg/dL (ref 8.9–10.3)
Chloride: 104 mmol/L (ref 98–111)
Creatinine, Ser: 0.57 mg/dL (ref 0.44–1.00)
GFR calc Af Amer: >60 mL/min (ref >60)
GFR calc non Af Amer: >60 mL/min (ref >60)
Glucose, Bld: 97 mg/dL (ref 70–99)
Potassium: 3.9 mmol/L (ref 3.5–5.1)
Sodium: 138 mmol/L (ref 135–145)

## 2019-04-30 LAB — CBC WITH DIFFERENTIAL/PLATELET
Abs Immature Granulocytes: 0.03 10*3/uL (ref 0.00–0.07)
Basophils Absolute: 0.1 10*3/uL (ref 0.0–0.1)
Basophils Relative: 1 %
Eosinophils Absolute: 0.2 10*3/uL (ref 0.0–0.5)
Eosinophils Relative: 2 %
HCT: 36.2 % (ref 36.0–46.0)
Hemoglobin: 11.5 g/dL — ABNORMAL LOW (ref 12.0–15.0)
Immature Granulocytes: 0 %
Lymphocytes Relative: 24 %
Lymphs Abs: 1.9 10*3/uL (ref 0.7–4.0)
MCH: 30 pg (ref 26.0–34.0)
MCHC: 31.8 g/dL (ref 30.0–36.0)
MCV: 94.5 fL (ref 80.0–100.0)
Monocytes Absolute: 0.5 10*3/uL (ref 0.1–1.0)
Monocytes Relative: 7 %
Neutro Abs: 5 10*3/uL (ref 1.7–7.7)
Neutrophils Relative %: 66 %
Platelets: 459 10*3/uL — ABNORMAL HIGH (ref 150–400)
RBC: 3.83 MIL/uL — ABNORMAL LOW (ref 3.87–5.11)
RDW: 13.1 % (ref 11.5–15.5)
WBC: 7.7 10*3/uL (ref 4.0–10.5)
nRBC: 0 % (ref 0.0–0.2)

## 2019-04-30 LAB — TYPE AND SCREEN
ABO/RH(D): A POS
Antibody Screen: NEGATIVE

## 2019-04-30 LAB — RESPIRATORY PANEL BY RT PCR (FLU A&B, COVID)
Influenza A by PCR: NEGATIVE
Influenza B by PCR: NEGATIVE
SARS Coronavirus 2 by RT PCR: NEGATIVE

## 2019-04-30 SURGERY — DILATION AND EVACUATION, UTERUS
Anesthesia: General

## 2019-04-30 MED ORDER — IBUPROFEN 600 MG PO TABS
ORAL_TABLET | ORAL | Status: AC
  Filled 2019-04-30: qty 1

## 2019-04-30 MED ORDER — IBUPROFEN 600 MG PO TABS
600.0000 mg | ORAL_TABLET | Freq: Four times a day (QID) | ORAL | Status: DC | PRN
  Administered 2019-04-30: 600 mg via ORAL
  Filled 2019-04-30: qty 1

## 2019-04-30 MED ORDER — MIDAZOLAM HCL 2 MG/2ML IJ SOLN
INTRAMUSCULAR | Status: AC
  Filled 2019-04-30: qty 2

## 2019-04-30 MED ORDER — CELECOXIB 200 MG PO CAPS
ORAL_CAPSULE | ORAL | Status: AC
  Filled 2019-04-30: qty 2

## 2019-04-30 MED ORDER — TRANEXAMIC ACID 1000 MG/10ML IV SOLN
INTRAVENOUS | Status: AC
  Filled 2019-04-30: qty 10

## 2019-04-30 MED ORDER — LACTATED RINGERS IV SOLN: INTRAVENOUS | Status: DC | PRN

## 2019-04-30 MED ORDER — DOXYCYCLINE HYCLATE 100 MG PO TABS
200.0000 mg | ORAL_TABLET | Freq: Once | ORAL | Status: DC
  Filled 2019-04-30: qty 2

## 2019-04-30 MED ORDER — PHENYLEPHRINE HCL (PRESSORS) 10 MG/ML IV SOLN
INTRAVENOUS | Status: DC | PRN
  Administered 2019-04-30: 100 ug via INTRAVENOUS

## 2019-04-30 MED ORDER — FENTANYL CITRATE (PF) 100 MCG/2ML IJ SOLN
25.0000 ug | INTRAMUSCULAR | Status: DC | PRN
  Administered 2019-04-30: 50 ug via INTRAVENOUS

## 2019-04-30 MED ORDER — ACETAMINOPHEN 500 MG PO TABS
ORAL_TABLET | ORAL | Status: AC
  Filled 2019-04-30: qty 2

## 2019-04-30 MED ORDER — FENTANYL CITRATE (PF) 100 MCG/2ML IJ SOLN
INTRAMUSCULAR | Status: AC
  Filled 2019-04-30: qty 2

## 2019-04-30 MED ORDER — LACTATED RINGERS IV SOLN
INTRAVENOUS | Status: DC
  Administered 2019-04-30: 125 mL/h via INTRAVENOUS

## 2019-04-30 MED ORDER — PROPOFOL 500 MG/50ML IV EMUL
INTRAVENOUS | Status: AC
  Filled 2019-04-30: qty 50

## 2019-04-30 MED ORDER — LIDOCAINE HCL (CARDIAC) PF 100 MG/5ML IV SOSY
PREFILLED_SYRINGE | INTRAVENOUS | Status: DC | PRN
  Administered 2019-04-30: 100 mg via INTRAVENOUS

## 2019-04-30 MED ORDER — TRANEXAMIC ACID 1000 MG/10ML IV SOLN
INTRAVENOUS | Status: DC | PRN
  Administered 2019-04-30: 18:00:00 1000 mg via INTRAVENOUS

## 2019-04-30 MED ORDER — ACETAMINOPHEN 500 MG PO TABS
1000.0000 mg | ORAL_TABLET | ORAL | Status: AC
  Administered 2019-04-30: 1000 mg via ORAL

## 2019-04-30 MED ORDER — METHYLERGONOVINE MALEATE 0.2 MG/ML IJ SOLN
INTRAMUSCULAR | Status: AC
  Filled 2019-04-30: qty 1

## 2019-04-30 MED ORDER — PROMETHAZINE HCL 25 MG/ML IJ SOLN: 6.2500 mg | INTRAMUSCULAR | Status: DC | PRN

## 2019-04-30 MED ORDER — FENTANYL CITRATE (PF) 100 MCG/2ML IJ SOLN
INTRAMUSCULAR | Status: DC | PRN
  Administered 2019-04-30 (×2): 25 ug via INTRAVENOUS
  Administered 2019-04-30: 50 ug via INTRAVENOUS

## 2019-04-30 MED ORDER — PROPOFOL 10 MG/ML IV BOLUS
INTRAVENOUS | Status: DC | PRN
  Administered 2019-04-30: 150 mg via INTRAVENOUS

## 2019-04-30 MED ORDER — GABAPENTIN 300 MG PO CAPS
300.0000 mg | ORAL_CAPSULE | ORAL | Status: AC
  Administered 2019-04-30: 300 mg via ORAL

## 2019-04-30 MED ORDER — METHYLERGONOVINE MALEATE 0.2 MG/ML IJ SOLN
INTRAMUSCULAR | Status: DC | PRN
  Administered 2019-04-30: .2 mg via INTRAMUSCULAR

## 2019-04-30 MED ORDER — CELECOXIB 200 MG PO CAPS
400.0000 mg | ORAL_CAPSULE | ORAL | Status: AC
  Administered 2019-04-30: 400 mg via ORAL

## 2019-04-30 MED ORDER — MIDAZOLAM HCL 2 MG/2ML IJ SOLN
INTRAMUSCULAR | Status: DC | PRN
  Administered 2019-04-30: 2 mg via INTRAVENOUS

## 2019-04-30 MED ORDER — GABAPENTIN 300 MG PO CAPS
ORAL_CAPSULE | ORAL | Status: AC
  Filled 2019-04-30: qty 1

## 2019-04-30 MED ORDER — CEFAZOLIN SODIUM-DEXTROSE 1-4 GM/50ML-% IV SOLN
INTRAVENOUS | Status: DC | PRN
  Administered 2019-04-30: 1 g via INTRAVENOUS

## 2019-04-30 SURGICAL SUPPLY — 22 items
CATH ROBINSON RED A/P 16FR (CATHETERS) ×3 IMPLANT
COVER WAND RF STERILE (DRAPES) ×3 IMPLANT
FILTER UTR ASPR SPEC (MISCELLANEOUS) ×1 IMPLANT
FLTR UTR ASPR SPEC (MISCELLANEOUS) ×3
GAUZE 4X4 16PLY RFD (DISPOSABLE) ×6 IMPLANT
GLOVE BIO SURGEON STRL SZ7 (GLOVE) ×3 IMPLANT
GLOVE INDICATOR 7.5 STRL GRN (GLOVE) ×3 IMPLANT
GOWN STRL REUS W/ TWL LRG LVL3 (GOWN DISPOSABLE) ×2 IMPLANT
GOWN STRL REUS W/TWL LRG LVL3 (GOWN DISPOSABLE) ×4
KIT BERKELEY 1ST TRIMESTER 3/8 (MISCELLANEOUS) ×3 IMPLANT
KIT TURNOVER CYSTO (KITS) ×3 IMPLANT
PACK DNC HYST (MISCELLANEOUS) ×3 IMPLANT
PAD OB MATERNITY 4.3X12.25 (PERSONAL CARE ITEMS) ×3 IMPLANT
PAD PREP 24X41 OB/GYN DISP (PERSONAL CARE ITEMS) ×3 IMPLANT
SET BERKELEY SUCTION TUBING (SUCTIONS) ×3 IMPLANT
TOWEL OR 17X26 4PK STRL BLUE (TOWEL DISPOSABLE) ×3 IMPLANT
VACURETTE 10 RIGID CVD (CANNULA) IMPLANT
VACURETTE 6 ASPIR F TIP BERK (CANNULA) IMPLANT
VACURETTE 7MM F TIP (CANNULA)
VACURETTE 7MM F TIP STRL (CANNULA) IMPLANT
VACURETTE 8 RIGID CVD (CANNULA) IMPLANT
VACURETTE 8MM F TIP (MISCELLANEOUS) ×3 IMPLANT

## 2019-04-30 NOTE — Anesthesia Procedure Notes (Signed)
Procedure Name: LMA Insertion Date/Time: 04/30/2019 5:48 PM Performed by: [employer], CRNA Pre-anesthesia Checklist: Patient identified, Patient being monitored, Timeout performed, Emergency Drugs available and Suction available Patient Re-evaluated:Patient Re-evaluated prior to induction Oxygen Delivery Method: Circle system utilized Preoxygenation: Pre-oxygenation with 100% oxygen Induction Type: IV induction Ventilation: Mask ventilation without difficulty LMA: LMA inserted LMA Size: 3.5 Tube type: Oral Number of attempts: 1 Placement Confirmation: positive ETCO2 and breath sounds checked- equal and bilateral Tube secured with: Tape Dental Injury: Teeth and Oropharynx as per pre-operative assessment

## 2019-04-30 NOTE — ED Notes (Signed)
Pt transported to OR

## 2019-04-30 NOTE — Anesthesia Post-op Follow-up Note (Signed)
Anesthesia QCDR form completed.        

## 2019-04-30 NOTE — Anesthesia Preprocedure Evaluation (Addendum)
Anesthesia Evaluation  Patient identified by MRN, date of birth, ID band Patient awake    Reviewed: Allergy & Precautions, H&P , NPO status , Patient's Chart, lab work & pertinent test results  History of Anesthesia Complications Negative for: history of anesthetic complications  Airway Mallampati: I       Dental  (+) Dental Advidsory Given, Teeth Intact   Pulmonary neg pulmonary ROS,    Pulmonary exam normal        Cardiovascular Exercise Tolerance: Good negative cardio ROS Normal cardiovascular exam     Neuro/Psych negative neurological ROS     GI/Hepatic negative GI ROS, Neg liver ROS,   Endo/Other  negative endocrine ROS  Renal/GU negative Renal ROS  negative genitourinary   Musculoskeletal   Abdominal   Peds  Hematology negative hematology ROS (+)   Anesthesia Other Findings History reviewed. No pertinent past medical history.   Reproductive/Obstetrics negative OB ROS                             Anesthesia Physical  Anesthesia Plan  ASA: II  Anesthesia Plan: General   Post-op Pain Management:    Induction: Intravenous  PONV Risk Score and Plan: 3 and Ondansetron, Dexamethasone, Midazolam and Treatment may vary due to age or medical condition  Airway Management Planned: LMA  Additional Equipment:   Intra-op Plan:   Post-operative Plan: Extubation in OR  Informed Consent: I have reviewed the patients History and Physical, chart, labs and discussed the procedure including the risks, benefits and alternatives for the proposed anesthesia with the patient or authorized representative who has indicated his/her understanding and acceptance.       Plan Discussed with: Anesthesiologist  Anesthesia Plan Comments:        Anesthesia Quick Evaluation

## 2019-04-30 NOTE — ED Notes (Signed)
Report given to OR.

## 2019-04-30 NOTE — Op Note (Signed)
Operative Report Suction Dilation and Curettage   Indications: Postpartum bleeding  Pre-operative Diagnosis:  Suspected retained products of conception   Post-operative Diagnosis: same.  Procedure: 1. Suction D&C  Surgeon: Benjaman Kindler, MD  Assistant(s):  None  Anesthesia: General LMA anesthesia  Anesthesiologist: No responsible provider has been recorded for the case. Anesthesiologist: Martha Clan, MD CRNA: Justus Memory, CRNA  Estimated Blood Loss:  150         Intraoperative medications: 0.2mg  IM methergine; 1g TXA         Total IV Fluids: 1020ml  Urine Output: 278ml         Specimens: products of conception         Complications:  None; patient tolerated the procedure well.         Disposition: PACU - hemodynamically stable.         Condition: stable  Findings: Uterus measuring 8 weeks; normal cervix, vagina, perineum. Cervix open with large clots at os.  Indication for procedure/Consents: 33 y.o. F here for scheduled surgery for treatment of postpartum bleeding with suspected retained products of conception.  Risks of surgery were discussed with the patient including but not limited to: bleeding which may require transfusion; infection which may require antibiotics; injury to uterus or surrounding organs; intrauterine scarring which may impair future fertility; need for additional procedures including laparotomy or laparoscopy; and other postoperative/anesthesia complications. Written informed consent was obtained.    Procedure Details:    She was taken to the operating room where general anesthesia was administered and was found to be adequate.  1g ancef given.  After a formal and adequate timeout was performed, she was placed in the dorsal lithotomy position and examined with the above findings. She was then prepped and draped in the sterile manner.   Her bladder was catheterized for an estimated amount of clear, yellow urine. A speculum was then placed  in the patient's vagina and a single tooth tenaculum was applied to the anterior lip of the cervix.    No uterine sounding was performed on this pregnant uterus. Her cervix was serially dilated to accommodate a 8 sized flexible suction curette.  A gentle sharp curettage was then performed until there was a gritty texture in all four quadrants.  Firm bimanual pressure held for about 15 min while methergine and TXA was given. The tenaculum was removed from the anterior lip of the cervix and the vaginal speculum was removed after noting good hemostasis. The patient tolerated the procedure well and was taken to the recovery area awake, extubated and in stable condition.  The patient will be discharged to home as per PACU criteria.  She will continue her home keflex as prescribed. Routine postoperative instructions given.  She was prescribed Percocet, Ibuprofen and Colace.  She will follow up in the clinic in two weeks for postoperative evaluation.

## 2019-04-30 NOTE — Discharge Instructions (Addendum)
AMBULATORY SURGERY  DISCHARGE INSTRUCTIONS   1) The drugs that you were given will stay in your system until tomorrow so for the next 24 hours you should not:  A) Drive an automobile B) Make any legal decisions C) Drink any alcoholic beverage   2) You may resume regular meals tomorrow.  Today it is better to start with liquids and gradually work up to solid foods.  You may eat anything you prefer, but it is better to start with liquids, then soup and crackers, and gradually work up to solid foods.   3) Please notify your doctor immediately if you have any unusual bleeding, trouble breathing, redness and pain at the surgery site, drainage, fever, or pain not relieved by medication.    4) Additional Instructions:  Ibuprofen given at 730 pm - may have again at 130 am if needed        Please contact your physician with any problems or Same Day Surgery at [phone], Monday through Friday 6 am to 4 pm, or [place] at [facility] number at [phone].

## 2019-04-30 NOTE — H&P (Addendum)
Consult History and Physical   SERVICE: Gynecology   Patient Name: Felicia Hammond Patient MRN:   [id number]  CC: Uterine pain, resolving bleeding  HPI: Felicia Hammond is a 33 y.o. [id number] with pelvic pain and bleeding 10 days after an otherwise uncomplicated vaginal delivery. 2 days ago she was seen in the outpatient office with an ultrasound suggestive of retained products of conception vs large endometrial clot. She was given 48 hrs of po methergine and Keflex, without improvement in pain, currently a 6/10. We discussed in office a suction D&C and she presents for this management.   Review of Systems: positives in bold GEN:   fevers, chills, weight changes, appetite changes, fatigue, night sweats HEENT:  HA, vision changes, hearing loss, congestion, rhinorrhea, sinus pressure, dysphagia CV:   CP, palpitations PULM:  SOB, cough GI:  Pelvic pain, vaginal bleeding, N/V/D/C GU:  dysuria, urgency, frequency MSK:  arthralgias, myalgias, back pain, swelling SKIN:  rashes, color changes, pallor NEURO:  numbness, weakness, tingling, seizures, dizziness, tremors PSYCH:  depression, anxiety, behavioral problems, confusion  HEME/LYMPH:  easy bruising or bleeding ENDO:  heat/cold intolerance  Past Obstetrical History: OB History    Gravida  2   Para  2   Term  2   Preterm      AB      Living  2     SAB      TAB      Ectopic      Multiple  0   Live Births  2           Past Gynecologic History: No LMP recorded. s/p vaginal delivery  Past Medical History: History reviewed. No pertinent past medical history.  Past Surgical History:   Past Surgical History:  Procedure Laterality Date  . KNEE ARTHROSCOPY Left 2006  . KNEE ARTHROSCOPY Right 2009  . LUNG SURGERY Right 2015    Family History:  family history is not on file. She was adopted.  Social History:  Social History   Socioeconomic History  . Marital status: Married    Spouse name: Thurmond Butts  . Number  of children: Not on file  . Years of education: Not on file  . Highest education level: Not on file  Occupational History  . Not on file  Tobacco Use  . Smoking status: Never Smoker  . Smokeless tobacco: Never Used  Substance and Sexual Activity  . Alcohol use: Not on file  . Drug use: Not on file  . Sexual activity: Yes    Birth control/protection: None  Other Topics Concern  . Not on file  Social History Narrative  . Not on file   Social Determinants of Health   Financial Resource Strain:   . Difficulty of Paying Living Expenses: Not on file  Food Insecurity:   . Worried About Charity fundraiser in the Last Year: Not on file  . Ran Out of Food in the Last Year: Not on file  Transportation Needs:   . Lack of Transportation (Medical): Not on file  . Lack of Transportation (Non-Medical): Not on file  Physical Activity:   . Days of Exercise per Week: Not on file  . Minutes of Exercise per Session: Not on file  Stress:   . Feeling of Stress : Not on file  Social Connections:   . Frequency of Communication with Friends and Family: Not on file  . Frequency of Social Gatherings with Friends and Family: Not on file  .  Attends Religious Services: Not on file  . Active Member of Clubs or Organizations: Not on file  . Attends BankerClub or Organization Meetings: Not on file  . Marital Status: Not on file  Intimate Partner Violence:   . Fear of Current or Ex-Partner: Not on file  . Emotionally Abused: Not on file  . Physically Abused: Not on file  . Sexually Abused: Not on file    Home Medications:  Medications reconciled in EPIC  No current facility-administered medications on file prior to encounter.   Current Outpatient Medications on File Prior to Encounter  Medication Sig Dispense Refill  . cephALEXin (KEFLEX) 250 MG capsule Take 250 mg by mouth 4 (four) times daily.    . Prenatal Vit-Fe Fumarate-FA (MULTIVITAMIN-PRENATAL) 27-0.8 MG TABS tablet Take 1 tablet by mouth daily  at 12 noon.      Allergies:  Allergies  Allergen Reactions  . Amoxicillin Rash    Physical Exam:  Temp:  [98.7 F (37.1 C)] 98.7 F (37.1 C) (12/19 1056) Pulse Rate:  [69-93] 77 (12/19 1552) Resp:  [16] 16 (12/19 1552) BP: (101-122)/(70-87) 107/74 (12/19 1552) SpO2:  [98 %-100 %] 100 % (12/19 1552) Weight:  [74.4 kg] 74.4 kg (12/19 1053)   General Appearance:  Well developed, well nourished, no acute distress, alert and oriented x3 HEENT:  Normocephalic atraumatic, extraocular movements intact, moist mucous membranes Cardiovascular:  Normal S1/S2, regular rate and rhythm, no murmurs Pulmonary:  clear to auscultation, no wheezes, rales or rhonchi, symmetric air entry, good air exchange Abdomen:  Bowel sounds present, soft, nontender, nondistended, no abnormal masses, no epigastric pain Extremities:  Full range of motion, no pedal edema, 2+ distal pulses, no tenderness Skin:  normal coloration and turgor, no rashes, no suspicious skin lesions noted  Neurologic:  Cranial nerves 2-12 grossly intact Psychiatric:  Normal mood and affect, appropriate, no AH/VH Pelvic:  Examine in office   Labs/Studies:   CBC and Coags:  Lab Results  Component Value Date   WBC 7.7 04/30/2019   NEUTOPHILPCT 66 04/30/2019   EOSPCT 2 04/30/2019   BASOPCT 1 04/30/2019   LYMPHOPCT 24 04/30/2019   HGB 11.5 (L) 04/30/2019   HCT 36.2 04/30/2019   MCV 94.5 04/30/2019   PLT 459 (H) 04/30/2019   CMP:  Lab Results  Component Value Date   NA 138 04/30/2019   K 3.9 04/30/2019   CL 104 04/30/2019   CO2 26 04/30/2019   BUN 15 04/30/2019   CREATININE 0.57 04/30/2019    TVUS:  Thickened ES 27.4366mm with normal ovaries bilaterally Other Imaging: No results found.   Assessment / Plan:   Felicia Hammond is a 33 y.o. [id number] who presents with pain and heterogenous material in the uterine cavity 2 weeks after a vaginal delivery, s/p methergine for 48 hrs and abx. No elevated WBC today.  Retained  products of conception, failed medical management:   Pt has opted for: suction D&C. She came in through the ER and her covid is negative. She ate a cookie at 9am and therefore we will wait 8 hours at anesthesia's request for this non-emergent surgery.  Consents signed today. Risks of surgery were discussed with the patient including but not limited to: bleeding which may require transfusion; infection which may require antibiotics; injury to uterus or surrounding organs; intrauterine scarring which may impair future fertility; need for additional procedures including laparotomy or laparoscopy; and other postoperative/anesthesia complications. Written informed consent was obtained.  This is a scheduled same-day surgery.  She will have a postop visit in 2 weeks to review operative findings and pathology.

## 2019-04-30 NOTE — Transfer of Care (Signed)
Immediate Anesthesia Transfer of Care Note  Patient: Felicia Hammond  Procedure(s) Performed: DILATATION AND EVACUATION (N/A )  Patient Location: PACU  Anesthesia Type:General  Level of Consciousness:  Airway & Oxygen Therapy: Patient Spontanous Breathing and Patient connected to face mask oxygen  Post-op Assessment: Report given to RN and Post -op Vital signs reviewed and stable  Post vital signs: Reviewed and stable  Last Vitals:  Vitals Value Taken Time  BP 120/66 04/30/19 1849  Temp 36.4 C 04/30/19 1849  Pulse 64 04/30/19 1849  Resp 14 04/30/19 1849  SpO2 100 % 04/30/19 1849    Last Pain:  Vitals:   04/30/19 1849  TempSrc:   PainSc: 5          Complications: No apparent anesthesia complications

## 2019-04-30 NOTE — ED Triage Notes (Signed)
Pt is 14 days post partum after vaginal delivery using about 1 pad per hour and having pelvic cramping.  Has retained products/clots per pt.  Here for [facility]

## 2019-04-30 NOTE — ED Provider Notes (Signed)
[facility] Emergency Department Provider Note ____________________________________________   First MD Initiated Contact with Patient 04/30/19 1053     (approximate)  I have reviewed the triage vital signs and the nursing notes.   HISTORY  Chief Complaint Vaginal Bleeding    HPI Felicia Hammond is a 33 y.o. female with PMH as noted below status post spontaneous vaginal delivery 2 weeks ago who presents with persistent vaginal bleeding and abdominal cramping.  The patient is going through approximately 1 pad per hour.  She saw Dr. Ouida Sills from OB/GYN 2 days ago for follow-up and was started on Methergine.  She was instructed to call and return if she had persistent symptoms.  She spoke to Dr. Leafy Ro this morning who advised her to come to the hospital.  History reviewed. No pertinent past medical history.  Patient Active Problem List   Diagnosis Date Noted  . NSVD (normal spontaneous vaginal delivery) 04/23/2019  . Anemia, postpartum 04/23/2019    Past Surgical History:  Procedure Laterality Date  . KNEE ARTHROSCOPY Left 2006  . KNEE ARTHROSCOPY Right 2009  . LUNG SURGERY Right 2015    Prior to Admission medications   Medication Sig Start Date End Date Taking? Authorizing Provider  cephALEXin (KEFLEX) 250 MG capsule Take 250 mg by mouth 4 (four) times daily. 04/28/19  Yes [provider]  Prenatal Vit-Fe Fumarate-FA (MULTIVITAMIN-PRENATAL) 27-0.8 MG TABS tablet Take 1 tablet by mouth daily at 12 noon.   Yes [provider]    Allergies Amoxicillin  Family History  Adopted: Yes    Social History Social History   Tobacco Use  . Smoking status: Never Smoker  . Smokeless tobacco: Never Used  Substance Use Topics  . Alcohol use: Not on file  . Drug use: Not on file    Review of Systems  Constitutional: No fever/chills Eyes: No visual changes.. ENT: No sore throat. Cardiovascular: Denies chest  pain. Respiratory: Denies shortness of breath. Gastrointestinal: No vomiting. Genitourinary: Positive for vaginal bleeding. Musculoskeletal: Negative for back pain. Skin: Negative for rash. Neurological: Negative for headache.   ____________________________________________   PHYSICAL EXAM:  VITAL SIGNS: ED Triage Vitals  Enc Vitals Group     BP 04/30/19 1056 111/87     Pulse Rate 04/30/19 1056 93     Resp 04/30/19 1056 16     Temp 04/30/19 1056 98.7 F (37.1 C)     Temp Source 04/30/19 1056 Oral     SpO2 04/30/19 1056 98 %     Weight 04/30/19 1053 164 lb (74.4 kg)     Height 04/30/19 1053 5\' 6"  (1.676 m)     Head Circumference --      Peak Flow --      Pain Score 04/30/19 1053 3     Pain Loc --      Pain Edu? --      Excl. in [place]? --     Constitutional: Alert and oriented. Well appearing and in no acute distress. Eyes: Conjunctivae are normal.  Head: Atraumatic. Nose: No congestion/rhinnorhea. Mouth/Throat: Mucous membranes are moist.   Neck: Normal range of motion.  Cardiovascular: Normal rate, regular rhythm. Good peripheral circulation. Respiratory: Normal respiratory effort.  No retractions. . Gastrointestinal: No distention.  Musculoskeletal: Extremities warm and well perfused.  Neurologic:  Normal speech and language. No gross focal neurologic deficits are appreciated.  Skin:  Skin is warm and dry. No rash noted. Psychiatric: Mood and affect are normal. Speech and behavior are  normal.  ____________________________________________   LABS (all labs ordered are listed, but only abnormal results are displayed)  Labs Reviewed  CBC WITH DIFFERENTIAL/PLATELET - Abnormal; Notable for the following components:      Result Value   RBC 3.83 (*)    Hemoglobin 11.5 (*)    Platelets 459 (*)    All other components within normal limits  RESPIRATORY PANEL BY RT PCR (FLU A&B, COVID)  BASIC METABOLIC PANEL  TYPE AND SCREEN    ____________________________________________  EKG   ____________________________________________  RADIOLOGY    ____________________________________________   PROCEDURES  Procedure(s) performed: No  Procedures  Critical Care performed: No ____________________________________________   INITIAL IMPRESSION / ASSESSMENT AND PLAN / ED COURSE  Pertinent labs & imaging results that were available during my care of the patient were reviewed by me and considered in my medical decision making (see chart for details).  33 year old female status post vaginal delivery 2 weeks ago presents with retained POC's and persistent bleeding and pain after being started on Methergine 2 days ago.  She spoke to Dr. Dalbert Garnet from OB/GYN who advised her to come in.  On exam, the patient is very well-appearing and her vital signs are stable.  The rest of the physical exam is unremarkable.  I consulted Dr. Dalbert Garnet from OB/GYN who advises that the patient will need a D&C.  The patient is pending labs and will need a Covid test prior to coming in.  _______________________________  Felicia Hammond was evaluated in Emergency Department on 04/30/2019 for the symptoms described in the history of present illness. She was evaluated in the context of the global COVID-19 pandemic, which necessitated consideration that the patient might be at risk for infection with the SARS-CoV-2 virus that causes COVID-19. Institutional protocols and algorithms that pertain to the evaluation of patients at risk for COVID-19 are in a state of rapid change based on information released by regulatory bodies including the CDC and federal and state organizations. These policies and algorithms were followed during the patient's care in the ED.  ____________________________________________   FINAL CLINICAL IMPRESSION(S) / ED DIAGNOSES  Final diagnoses:  Retained products of conception, following delivery with hemorrhage       NEW MEDICATIONS STARTED DURING THIS VISIT:  New Prescriptions   No medications on file     Note:  This document was prepared using Dragon voice recognition software and may include unintentional dictation errors.    Dionne Bucy, MD 04/30/19 [phone]

## 2019-04-30 NOTE — Anesthesia Postprocedure Evaluation (Signed)
Anesthesia Post Note  Patient: Felicia Hammond  Procedure(s) Performed: DILATATION AND EVACUATION (N/A )  Patient location during evaluation: PACU Anesthesia Type: General Level of consciousness: awake and alert Pain management: pain level controlled Vital Signs Assessment: post-procedure vital signs reviewed and stable Respiratory status: spontaneous breathing, nonlabored ventilation, respiratory function stable and patient connected to nasal cannula oxygen Cardiovascular status: blood pressure returned to baseline and stable Postop Assessment: no apparent nausea or vomiting Anesthetic complications: no     Last Vitals:  Vitals:   04/30/19 1926 04/30/19 1940  BP: 114/75 115/72  Pulse: (!) 56 61  Resp:  16  Temp: (!) 36.3 C   SpO2: 100% 100%    Last Pain:  Vitals:   04/30/19 1940  TempSrc:   PainSc: 4                  Martha Clan

## 2019-05-01 ENCOUNTER — Inpatient Hospital Stay: Admission: RE | Admit: 2019-05-01 | Payer: Self-pay | Source: Home / Self Care | Admitting: *Deleted

## 2019-05-03 LAB — SURGICAL PATHOLOGY

## 2019-06-29 ENCOUNTER — Encounter: Payer: Self-pay | Admitting: *Deleted

## 2021-03-30 ENCOUNTER — Other Ambulatory Visit: Payer: Self-pay

## 2021-03-30 ENCOUNTER — Ambulatory Visit
Admission: EM | Admit: 2021-03-30 | Discharge: 2021-03-30 | Disposition: A | Discharge status: Home / Self Care | Payer: 59 | Attending: Emergency Medicine | Admitting: Emergency Medicine

## 2021-03-30 ENCOUNTER — Encounter: Payer: Self-pay | Admitting: Emergency Medicine

## 2021-03-30 DIAGNOSIS — J069 Acute upper respiratory infection, unspecified: Secondary | ICD-10-CM | POA: Diagnosis not present

## 2021-03-30 DIAGNOSIS — J029 Acute pharyngitis, unspecified: Secondary | ICD-10-CM | POA: Diagnosis not present

## 2021-03-30 DIAGNOSIS — H9202 Otalgia, left ear: Secondary | ICD-10-CM

## 2021-03-30 LAB — POCT RAPID STREP A (OFFICE): Rapid Strep A Screen: NEGATIVE

## 2021-03-30 NOTE — Discharge Instructions (Addendum)
Your strep test is negative.  Your COVID and flu tests are pending.  Take Tylenol or ibuprofen as needed for fever or discomfort.  Follow up with your primary care provider if your symptoms are not improving.

## 2021-03-30 NOTE — ED Provider Notes (Signed)
Felicia Hammond    CSN: [id number] Arrival date & time: 03/30/21  0810      History   Chief Complaint Chief Complaint  Patient presents with   Sore Throat     HPI Felicia Hammond is a 35 y.o. female.  Patient presents with sore throat and left ear pain x1 day.  No fever, chills, rash, cough, shortness of breath, vomiting, diarrhea, or other symptoms.  Treatment at home with Tylenol cold medication and NyQuil taken last night.  No pertinent medical history.  The history is provided by the patient and medical records.   History reviewed. No pertinent past medical history.  Patient Active Problem List   Diagnosis Date Noted   NSVD (normal spontaneous vaginal delivery) 04/23/2019   Anemia, postpartum 04/23/2019    Past Surgical History:  Procedure Laterality Date   DILATION AND EVACUATION N/A 04/30/2019   Procedure: DILATATION AND EVACUATION;  Surgeon: Christeen Douglas, MD;  Location: [facility] ORS;  Service: Gynecology;  Laterality: N/A;   KNEE ARTHROSCOPY Left 2006   KNEE ARTHROSCOPY Right 2009   LUNG SURGERY Right 2015    OB History     Gravida  2   Para  2   Term  2   Preterm      AB      Living  2      SAB      IAB      Ectopic      Multiple  0   Live Births  2            Home Medications    Prior to Admission medications   Medication Sig Start Date End Date Taking? Authorizing Provider  cephALEXin (KEFLEX) 250 MG capsule Take 250 mg by mouth 4 (four) times daily. 04/28/19   [provider]  Prenatal Vit-Fe Fumarate-FA (MULTIVITAMIN-PRENATAL) 27-0.8 MG TABS tablet Take 1 tablet by mouth daily at 12 noon.    [provider]    Family History Family History  Adopted: Yes    Social History Social History   Tobacco Use   Smoking status: Never   Smokeless tobacco: Never     Allergies   Amoxicillin   Review of Systems Review of Systems  Constitutional:  Negative for chills and fever.  HENT:  Positive  for ear pain and sore throat. Negative for ear discharge and hearing loss.   Respiratory:  Negative for cough and shortness of breath.   Cardiovascular:  Negative for chest pain and palpitations.  Gastrointestinal:  Negative for diarrhea and vomiting.  Skin:  Negative for color change and rash.  All other systems reviewed and are negative.   Physical Exam Triage Vital Signs ED Triage Vitals  Enc Vitals Group     BP      Pulse      Resp      Temp      Temp src      SpO2      Weight      Height      Head Circumference      Peak Flow      Pain Score      Pain Loc      Pain Edu?      Excl. in [place]?    No data found.  Updated Vital Signs BP 118/83 (BP Location: Left Arm)   Pulse 99   Temp 98.3 F (36.8 C) (Oral)   Resp 16   LMP 03/20/2021 (Approximate)  SpO2 98%   Visual Acuity Right Eye Distance:   Left Eye Distance:   Bilateral Distance:    Right Eye Near:   Left Eye Near:    Bilateral Near:     Physical Exam Vitals and nursing note reviewed.  Constitutional:      General: She is not in acute distress.    Appearance: She is well-developed. She is not ill-appearing.  HENT:     Head: Normocephalic and atraumatic.     Right Ear: Tympanic membrane normal.     Left Ear: Tympanic membrane normal.     Nose: Nose normal.     Mouth/Throat:     Mouth: Mucous membranes are moist.     Pharynx: Posterior oropharyngeal erythema present.  Eyes:     Conjunctiva/sclera: Conjunctivae normal.  Cardiovascular:     Rate and Rhythm: Normal rate and regular rhythm.     Heart sounds: Normal heart sounds.  Pulmonary:     Effort: Pulmonary effort is normal. No respiratory distress.     Breath sounds: Normal breath sounds.  Abdominal:     Palpations: Abdomen is soft.     Tenderness: There is no abdominal tenderness.  Musculoskeletal:     Cervical back: Neck supple.  Skin:    General: Skin is warm and dry.  Neurological:     Mental Status: She is alert.  Psychiatric:         Mood and Affect: Mood normal.        Behavior: Behavior normal.     UC Treatments / Results  Labs (all labs ordered are listed, but only abnormal results are displayed) Labs Reviewed  COVID-19, FLU A+B NAA  POCT RAPID STREP A (OFFICE)    EKG   Radiology No results found.  Procedures Procedures (including critical care time)  Medications Ordered in UC Medications - No data to display  Initial Impression / Assessment and Plan / UC Course  I have reviewed the triage vital signs and the nursing notes.  Pertinent labs & imaging results that were available during my care of the patient were reviewed by me and considered in my medical decision making (see chart for details).  Viral pharyngitis, left otalgia, viral URI.  Rapid strep negative.  COVID and Flu pending.  Instructed patient to self quarantine per CDC guidelines.  Discussed symptomatic treatment including Tylenol or ibuprofen, rest, hydration.  Instructed patient to follow up with PCP if symptoms are not improving.  Patient agrees to plan of care.    Final Clinical Impressions(s) / UC Diagnoses   Final diagnoses:  Viral pharyngitis  Otalgia, left  Viral URI     Discharge Instructions      Your strep test is negative.  Your COVID and flu tests are pending.  Take Tylenol or ibuprofen as needed for fever or discomfort.  Follow up with your primary care provider if your symptoms are not improving.         ED Prescriptions   None    PDMP not reviewed this encounter.   Mickie Bail, NP 03/30/21 [phone]

## 2021-03-30 NOTE — ED Triage Notes (Signed)
Pt here with sore throat and left ear pain with some minor body aches since yesterday morning.

## 2021-03-31 LAB — COVID-19, FLU A+B NAA
Influenza A, NAA: NOT DETECTED
Influenza B, NAA: NOT DETECTED
SARS-CoV-2, NAA: NOT DETECTED

## 2021-10-15 ENCOUNTER — Encounter: Payer: Self-pay | Admitting: Emergency Medicine

## 2021-10-15 ENCOUNTER — Emergency Department
Admission: EM | Admit: 2021-10-15 | Discharge: 2021-10-16 | Disposition: A | Discharge status: Home / Self Care | Payer: BC Managed Care – PPO | Attending: Emergency Medicine | Admitting: Emergency Medicine

## 2021-10-15 ENCOUNTER — Emergency Department: Payer: BC Managed Care – PPO

## 2021-10-15 DIAGNOSIS — M25511 Pain in right shoulder: Secondary | ICD-10-CM | POA: Insufficient documentation

## 2021-10-15 DIAGNOSIS — R059 Cough, unspecified: Secondary | ICD-10-CM | POA: Insufficient documentation

## 2021-10-15 DIAGNOSIS — D72829 Elevated white blood cell count, unspecified: Secondary | ICD-10-CM | POA: Diagnosis not present

## 2021-10-15 DIAGNOSIS — R0602 Shortness of breath: Secondary | ICD-10-CM | POA: Insufficient documentation

## 2021-10-15 DIAGNOSIS — R079 Chest pain, unspecified: Secondary | ICD-10-CM | POA: Insufficient documentation

## 2021-10-15 DIAGNOSIS — M546 Pain in thoracic spine: Secondary | ICD-10-CM | POA: Diagnosis not present

## 2021-10-15 LAB — CBC
HCT: 39.1 % (ref 36.0–46.0)
Hemoglobin: 12.8 g/dL (ref 12.0–15.0)
MCH: 31.1 pg (ref 26.0–34.0)
MCHC: 32.7 g/dL (ref 30.0–36.0)
MCV: 95.1 fL (ref 80.0–100.0)
Platelets: 448 10*3/uL — ABNORMAL HIGH (ref 150–400)
RBC: 4.11 MIL/uL (ref 3.87–5.11)
RDW: 11.2 % — ABNORMAL LOW (ref 11.5–15.5)
WBC: 11.6 10*3/uL — ABNORMAL HIGH (ref 4.0–10.5)
nRBC: 0 % (ref 0.0–0.2)

## 2021-10-15 LAB — POC URINE PREG, ED: Preg Test, Ur: NEGATIVE

## 2021-10-15 LAB — BASIC METABOLIC PANEL
Anion gap: 11 (ref 5–15)
BUN: 25 mg/dL — ABNORMAL HIGH (ref 6–20)
CO2: 27 mmol/L (ref 22–32)
Calcium: 9.5 mg/dL (ref 8.9–10.3)
Chloride: 99 mmol/L (ref 98–111)
Creatinine, Ser: 0.75 mg/dL (ref 0.44–1.00)
GFR, Estimated: >60 mL/min (ref >60)
Glucose, Bld: 93 mg/dL (ref 70–99)
Potassium: 3.6 mmol/L (ref 3.5–5.1)
Sodium: 137 mmol/L (ref 135–145)

## 2021-10-15 LAB — TROPONIN I (HIGH SENSITIVITY)
Troponin I (High Sensitivity): 3 ng/L (ref <18)
Troponin I (High Sensitivity): 4 ng/L (ref <18)

## 2021-10-15 NOTE — ED Triage Notes (Signed)
Pt presents via POV with complaints of midsternal CP that started yesterday and has progressively gotten worse. She notes any radiation with the pain and describes it as pressure that lasts 1-2 minutes. Denies N/V or SOB.

## 2021-10-16 LAB — D-DIMER, QUANTITATIVE: D-Dimer, Quant: <0.27 ug/mL-FEU (ref 0.00–0.50)

## 2021-10-16 NOTE — ED Provider Notes (Signed)
[facility] Provider Note    Event Date/Time   First MD Initiated Contact with Patient 10/16/21 0103     (approximate)   History   Chest Pain   HPI  Felicia Hammond is a 36 y.o. 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  He states it occurs starting today and last 30 seconds to a minute and resolves on its own.  Her pain in her right shoulder is reproducible.  Chest pain is not reproducible.  It is not related to exertion or position.  She states she still has a slight cough and slight short sore throat but otherwise no worsening of her symptoms or hemoptysis.  No fevers, nausea, vomiting, diarrhea, urinary symptoms rash or extremity pain.  No history of DVT PE or CAD.  He is declining any analgesia at this time.  She does not smoke use illicit drugs or drink [place].      Physical Exam  Triage Vital Signs: ED Triage Vitals  Enc Vitals Group     BP 10/15/21 2103 (!) 147/88     Pulse Rate 10/15/21 2103 68     Resp 10/15/21 2103 14     Temp 10/15/21 2103 97.7 F (36.5 C)     Temp Source 10/16/21 0057 Oral     SpO2 10/15/21 2103 100 %     Weight 10/15/21 2100 167 lb 8.8 oz (76 kg)     Height 10/15/21 2100 5\' 6"  (1.676 m)     Head Circumference --      Peak Flow --      Pain Score 10/15/21 2100 8     Pain Loc --      Pain Edu? --      Excl. in [place]? --     Most recent vital signs: Vitals:   10/15/21 2103 10/16/21 0057  BP: (!) 147/88 130/88  Pulse: 68 70  Resp: 14 20  Temp: 97.7 F (36.5 C) 98 F (36.7 C)  SpO2: 100% 100%    General: Awake, no  distress.  CV:  Good peripheral perfusion.  2+ radial pulse. Resp:  Normal effort.  Clear bilaterally. Abd:  No distention.  Soft. Other:  Oropharynx has some mild posterior oropharyngeal erythema but no exudates tonsillar lodgment or uvular deviation.  Oropharynx otherwise unremarkable.  Patient has no neck stiffness.   ED Results / Procedures / Treatments  Labs (all labs ordered are listed, but only abnormal results are displayed) Labs Reviewed  BASIC METABOLIC PANEL - Abnormal; Notable for the following components:      Result Value   BUN 25 (*)    All other components within normal limits  CBC - Abnormal; Notable for the following components:   WBC 11.6 (*)    RDW 11.2 (*)    Platelets 448 (*)    All other components within normal limits  D-DIMER, QUANTITATIVE  POC URINE PREG, ED  TROPONIN I (HIGH SENSITIVITY)  TROPONIN I (HIGH SENSITIVITY)     EKG  EKG is remarkable for sinus rhythm with a ventricular rate of 76, normal axis, unremarkable intervals without evidence of acute  ischemia or significant arrhythmia.   RADIOLOGY Chest reviewed by myself shows no focal consoidation, effusion, edema, pneumothorax or other clear acute thoracic process. I also reviewed radiology interpretation and agree with findings described.    PROCEDURES:  Critical Care performed: No  Procedures    MEDICATIONS ORDERED IN ED: Medications - No data to display   IMPRESSION / MDM / [facility] / ED COURSE  I reviewed the triage vital signs and the nursing notes. Patient's presentation is most consistent with acute presentation with potential threat to life or bodily function.                               Differential diagnosis includes, but is not limited to, PE, bacterial pneumonia, pneumothorax, pleurisy and ongoing symptoms related to bronchitis.  Given overall description of symptoms have a low suspicion for dissection and GI etiologies.  EKG is remarkable for sinus  rhythm with a ventricular rate of 76, normal axis, unremarkable intervals without evidence of acute ischemia or significant arrhythmia.  Chest reviewed by myself shows no focal consoidation, effusion, edema, pneumothorax or other clear acute thoracic process. I also reviewed radiology interpretation and agree with findings described.  Troponin nonelevated x2 and reassuring EKG is not suggestive of ACS or myocarditis.  Pregnancy test is negative.  BMP shows no significant electrolyte or metabolic derangements.  CBC shows WBC count of 11.6 possibly reactive in the setting of prednisone use versus home mild inflammation from her bronchitis and possible pleurisy.  No evidence of acute anemia.  Low suspicion for PE as D-dimer is undetectable.  I suspect likely musculoskeletal or pleuritic symptoms related pleurisy or some inflammation in her chest wall from her bronchitis.  Advised her to take Tylenol and Profen as needed and that she can follow-up with her PCP.  At this point do not believe she requires further testing and I think outpatient follow-up is appropriate.  I discussed returning for any new or worsening of symptoms.  Discharged in stable condition.  Strict return precautions advised and discussed.      FINAL CLINICAL IMPRESSION(S) / ED DIAGNOSES   Final diagnoses:  Chest pain, unspecified type     Rx / DC Orders   ED Discharge Orders     None        Note:  This document was prepared using Dragon voice recognition software and may include unintentional dictation errors.   Lucrezia Starch, MD 10/16/21 [phone]

## 2021-10-16 NOTE — Discharge Instructions (Addendum)
As we discussed I think it is reasonable to trial 400 mg of ibuprofen and Tylenol up to 1 g every 6 hours as needed for your pain.  Please return to the emergency room for any new or worsening of your symptoms.  Otherwise at this point think you will are stable to follow-up with a primary care.

## 2021-10-16 NOTE — ED Notes (Signed)
E-signature pad unavailable - Pt verbalized understanding of D/C information - no additional concerns at this time.  

## 2022-09-14 ENCOUNTER — Emergency Department
Admission: EM | Admit: 2022-09-14 | Discharge: 2022-09-14 | Disposition: A | Discharge status: Home / Self Care | Payer: BC Managed Care – PPO | Attending: Emergency Medicine | Admitting: Emergency Medicine

## 2022-09-14 ENCOUNTER — Emergency Department: Payer: BC Managed Care – PPO

## 2022-09-14 ENCOUNTER — Other Ambulatory Visit: Payer: Self-pay

## 2022-09-14 DIAGNOSIS — M542 Cervicalgia: Secondary | ICD-10-CM | POA: Insufficient documentation

## 2022-09-14 DIAGNOSIS — R21 Rash and other nonspecific skin eruption: Secondary | ICD-10-CM | POA: Insufficient documentation

## 2022-09-14 LAB — CBC WITH DIFFERENTIAL/PLATELET
Abs Immature Granulocytes: 0.03 10*3/uL (ref 0.00–0.07)
Basophils Absolute: 0 10*3/uL (ref 0.0–0.1)
Basophils Relative: 0 %
Eosinophils Absolute: 0.2 10*3/uL (ref 0.0–0.5)
Eosinophils Relative: 2 %
HCT: 38.3 % (ref 36.0–46.0)
Hemoglobin: 12.6 g/dL (ref 12.0–15.0)
Immature Granulocytes: 0 %
Lymphocytes Relative: 23 %
Lymphs Abs: 1.7 10*3/uL (ref 0.7–4.0)
MCH: 31.5 pg (ref 26.0–34.0)
MCHC: 32.9 g/dL (ref 30.0–36.0)
MCV: 95.8 fL (ref 80.0–100.0)
Monocytes Absolute: 0.4 10*3/uL (ref 0.1–1.0)
Monocytes Relative: 6 %
Neutro Abs: 5.1 10*3/uL (ref 1.7–7.7)
Neutrophils Relative %: 69 %
Platelets: 241 10*3/uL (ref 150–400)
RBC: 4 MIL/uL (ref 3.87–5.11)
RDW: 11.2 % — ABNORMAL LOW (ref 11.5–15.5)
WBC: 7.4 10*3/uL (ref 4.0–10.5)
nRBC: 0 % (ref 0.0–0.2)

## 2022-09-14 LAB — COMPREHENSIVE METABOLIC PANEL
ALT: 8 U/L (ref 0–44)
AST: 17 U/L (ref 15–41)
Albumin: 4 g/dL (ref 3.5–5.0)
Alkaline Phosphatase: 40 U/L (ref 38–126)
Anion gap: 5 (ref 5–15)
BUN: 14 mg/dL (ref 6–20)
CO2: 26 mmol/L (ref 22–32)
Calcium: 9.1 mg/dL (ref 8.9–10.3)
Chloride: 103 mmol/L (ref 98–111)
Creatinine, Ser: 0.67 mg/dL (ref 0.44–1.00)
GFR, Estimated: >60 mL/min (ref >60)
Glucose, Bld: 91 mg/dL (ref 70–99)
Potassium: 3.8 mmol/L (ref 3.5–5.1)
Sodium: 134 mmol/L — ABNORMAL LOW (ref 135–145)
Total Bilirubin: 0.5 mg/dL (ref 0.3–1.2)
Total Protein: 7.1 g/dL (ref 6.5–8.1)

## 2022-09-14 LAB — TROPONIN I (HIGH SENSITIVITY): Troponin I (High Sensitivity): <2 ng/L (ref <18)

## 2022-09-14 LAB — POC URINE PREG, ED: Preg Test, Ur: NEGATIVE

## 2022-09-14 MED ORDER — IOHEXOL 350 MG/ML SOLN
75.0000 mL | Freq: Once | INTRAVENOUS | Status: AC | PRN
  Administered 2022-09-14: 75 mL via INTRAVENOUS

## 2022-09-14 MED ORDER — HYDROXYZINE HCL 25 MG PO TABS
25.0000 mg | ORAL_TABLET | Freq: Three times a day (TID) | ORAL | 0 refills | Status: AC | PRN
Start: 2022-09-14 — End: ?

## 2022-09-14 MED ORDER — HYDROXYZINE HCL 25 MG PO TABS
25.0000 mg | ORAL_TABLET | Freq: Once | ORAL | Status: AC
  Administered 2022-09-14: 25 mg via ORAL
  Filled 2022-09-14: qty 1

## 2022-09-14 NOTE — Discharge Instructions (Signed)
Follow-up with your regular doctor.  Return if worsening.  Take Atarax as needed for itching.  Continue use your steroid cream on the rash.

## 2022-09-14 NOTE — ED Triage Notes (Signed)
Pt reports having a port place on Thursday this week for chemo tx of breast cancer. Pt is now having a red itchy rash across her chest and around the port site.

## 2022-09-14 NOTE — ED Provider Notes (Signed)
[facility] Provider Note    Event Date/Time   First MD Initiated Contact with Patient 09/14/22 1123     (approximate)   History   Allergic Reaction   HPI  Felicia Hammond is a 37 y.o. female with history of breast cancer presents emergency department for neck/throat pain.  Patient had a port placed on Thursday of this week for chemotherapy.  States she reacted to the cleaning solution that they used on her chest.  Still has a rash for that but is concerned about the pressure at the base of her neck.  Is worse if she lies on the right side.  Called her doctor at [facility] and was told to come the emergency department.      Physical Exam   Triage Vital Signs: ED Triage Vitals  Enc Vitals Group     BP 09/14/22 1020 (!) 127/93     Pulse Rate 09/14/22 1020 85     Resp 09/14/22 1020 18     Temp 09/14/22 1020 98.5 F (36.9 C)     Temp Source 09/14/22 1020 Oral     SpO2 09/14/22 1020 100 %     Weight 09/14/22 1021 130 lb (59 kg)     Height 09/14/22 1021 5\' 6"  (1.676 m)     Head Circumference --      Peak Flow --      Pain Score 09/14/22 1021 5     Pain Loc --      Pain Edu? --      Excl. in [place]? --     Most recent vital signs: Vitals:   09/14/22 1020  BP: (!) 127/93  Pulse: 85  Resp: 18  Temp: 98.5 F (36.9 C)  SpO2: 100%     General: Awake, no distress.   CV:  Good peripheral perfusion. regular rate and  rhythm Resp:  Normal effort. Lungs cta Abd:  No distention.   Other:  Skin with rash noted on the chest typical of a contact dermatitis, patient points to an area that is at the base of the clavicle extending into the right side of the neck where she is feeling pressure.  No bruits noted   ED Results / Procedures / Treatments   Labs (all labs ordered are listed, but only abnormal results are displayed) Labs Reviewed  COMPREHENSIVE METABOLIC PANEL - Abnormal; Notable for the following components:      Result Value   Sodium 134 (*)     All other components within normal limits  CBC WITH DIFFERENTIAL/PLATELET - Abnormal; Notable for the following components:   RDW 11.2 (*)    All other components within normal limits  POC URINE PREG, ED  TROPONIN I (HIGH SENSITIVITY)  TROPONIN I (HIGH SENSITIVITY)     EKG     RADIOLOGY CT of the chest for PE, CT soft tissue of the neck    PROCEDURES:   Procedures   MEDICATIONS ORDERED IN ED: Medications  iohexol (OMNIPAQUE) 350 MG/ML injection 75 mL (75 mLs Intravenous Contrast Given 09/14/22 1338)  hydrOXYzine (ATARAX) tablet 25 mg (25 mg Oral Given 09/14/22 1450)     IMPRESSION / MDM / ASSESSMENT AND PLAN / ED COURSE  I reviewed the triage vital signs and the nursing notes.                              Differential diagnosis includes, but is not  limited to, cellulitis, allergic reaction, contact dermatitis, PE, and inaccurate port insertion, infection, rupture  Patient's presentation is most consistent with acute presentation with potential threat to life or bodily function.   Labs are reassuring  I did have Dr. Fuller Plan evaluate patient.  She did speak with radiology concerning which CT we should get to evaluate the patient.  CT of the chest for PE, CT soft tissue of the neck  CTs were independently reviewed and interpreted by me after reading radiologist report as being negative for any acute abnormality  Did discuss these findings with Dr. Fuller Plan also, feel that she is stable to be discharged.  Dr. Fuller Plan is in agreement.  She is given Atarax for itching.  Follow-up with her doctor.  Return if worsening.      FINAL CLINICAL IMPRESSION(S) / ED DIAGNOSES   Final diagnoses:  Neck pain     Rx / DC Orders   ED Discharge Orders          Ordered    hydrOXYzine (ATARAX) 25 MG tablet  3 times daily PRN        09/14/22 1447             Note:  This document was prepared using Dragon voice recognition software and may include unintentional dictation  errors.    Faythe Ghee, PA-C 09/14/22 1451    Concha Se, MD 09/14/22 Ernestina [place]

## 2022-12-19 IMAGING — CR DG CHEST 2V
2 series · 2 of 2 positions shown · non-contrast
Comparison: None Available.

CLINICAL DATA: Chest pain for 2 days

EXAM:
CHEST - 2 VIEW

[chest pa]
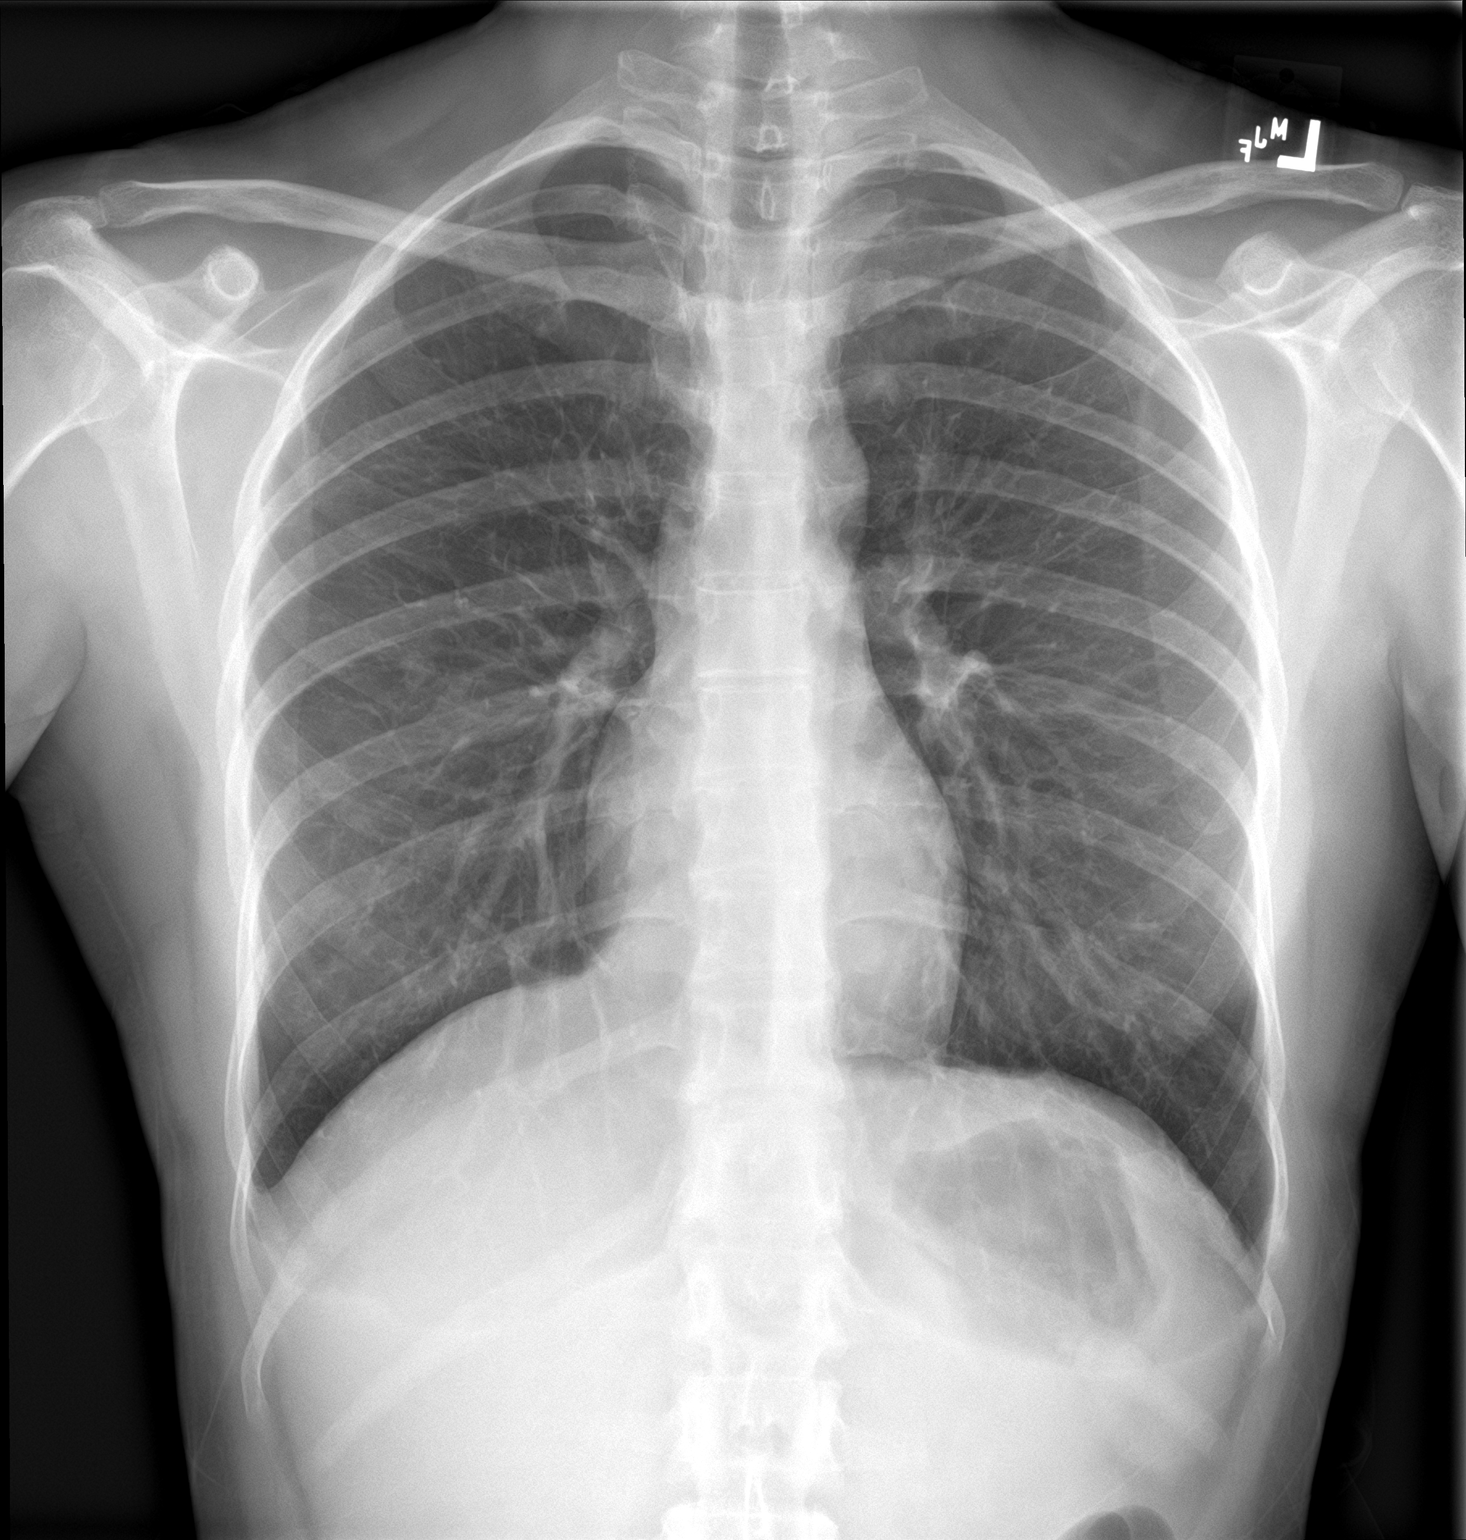

[chest lat]
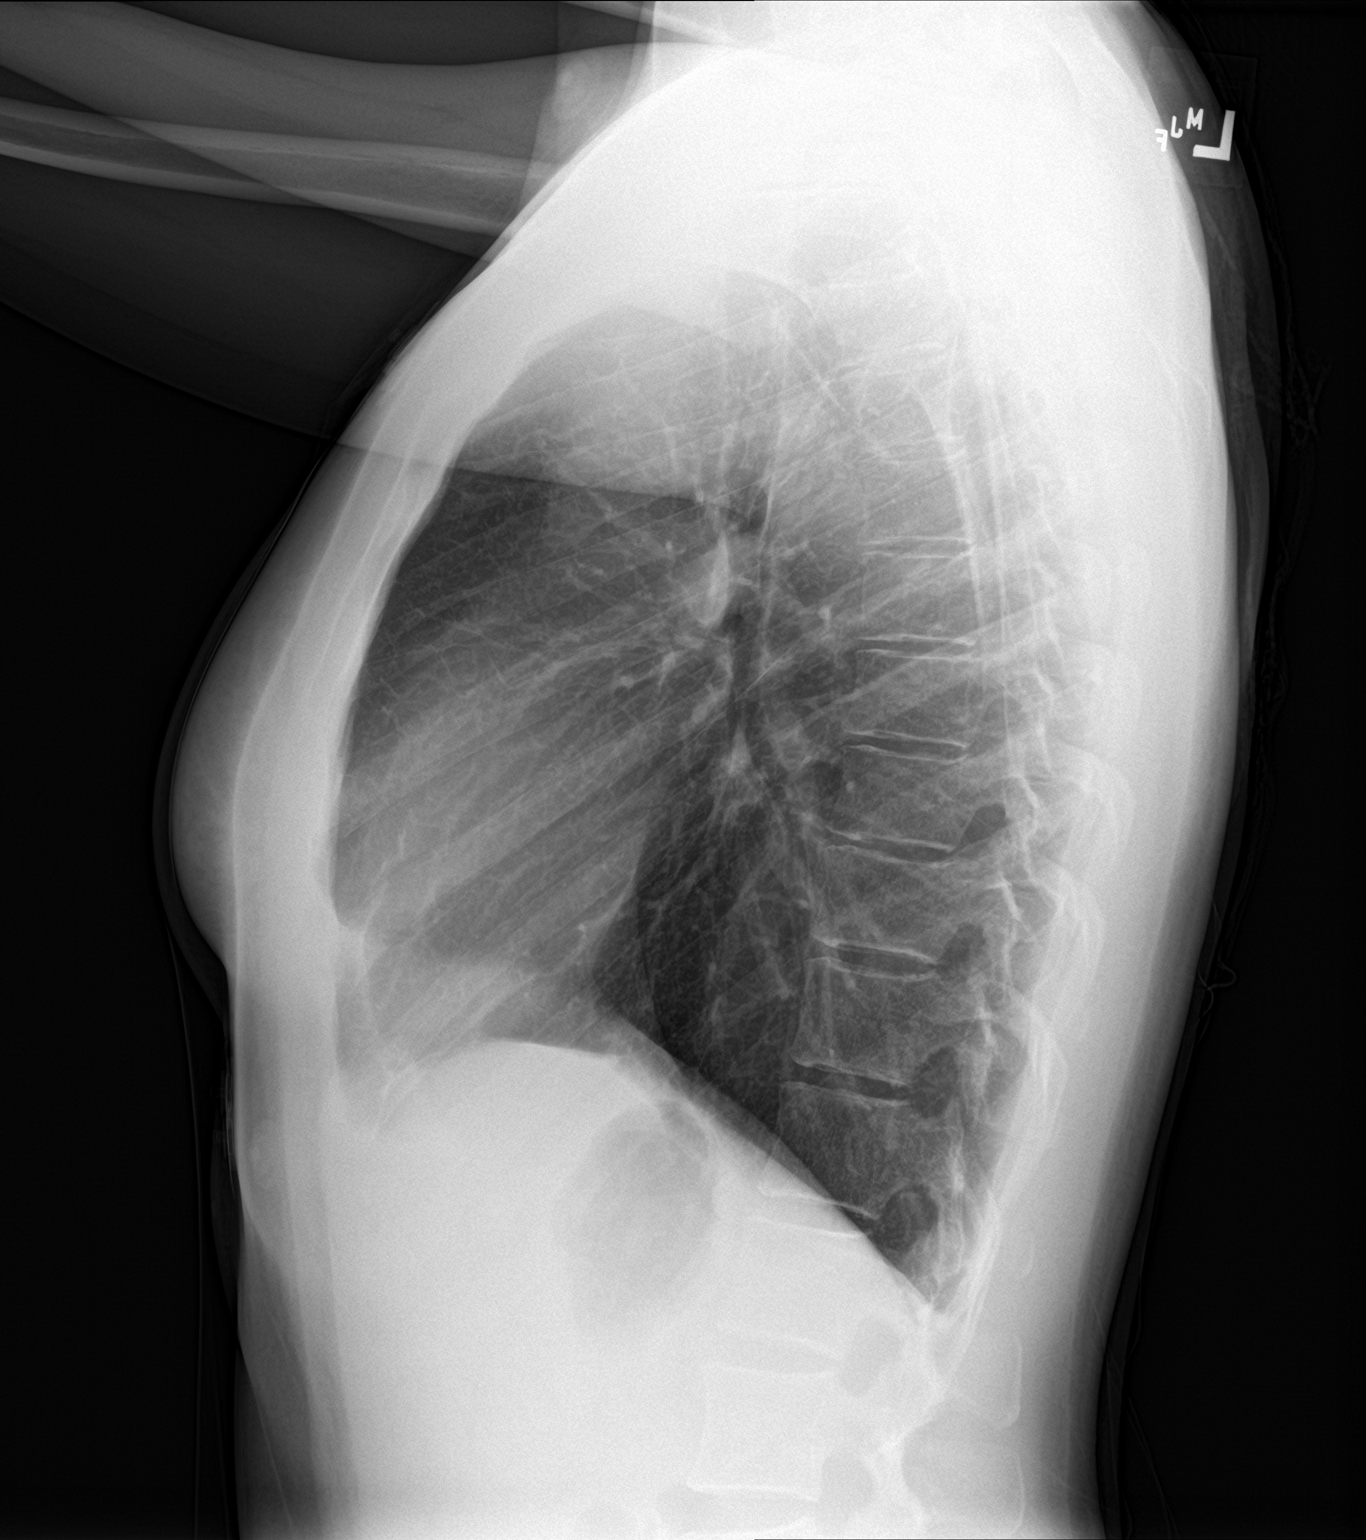

[2 of 2 positions shown; findings below may reference images not displayed]

FINDINGS: The heart size and mediastinal contours are within normal limits.
Both lungs are clear. The visualized skeletal structures are
unremarkable.
IMPRESSION: No active cardiopulmonary disease.
